# Patient Record
Sex: Male | Born: 1980 | Race: White | Hispanic: No | Smoking: Former smoker
Health system: Southern US, Community
[De-identification: ages and names within clinical notes are randomized; demographics above are authoritative.]

## PROBLEM LIST (undated history)

## (undated) DIAGNOSIS — I499 Cardiac arrhythmia, unspecified: Secondary | ICD-10-CM

## (undated) DIAGNOSIS — N200 Calculus of kidney: Secondary | ICD-10-CM

## (undated) DIAGNOSIS — J302 Other seasonal allergic rhinitis: Secondary | ICD-10-CM

## (undated) DIAGNOSIS — K219 Gastro-esophageal reflux disease without esophagitis: Secondary | ICD-10-CM

---
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
## 2005-01-20 ENCOUNTER — Emergency Department: Payer: Self-pay | Admitting: General Practice

## 2005-01-20 ENCOUNTER — Other Ambulatory Visit: Payer: Self-pay

## 2006-01-22 ENCOUNTER — Emergency Department: Payer: Self-pay | Admitting: Emergency Medicine

## 2006-06-11 ENCOUNTER — Emergency Department: Payer: Self-pay | Admitting: Emergency Medicine

## 2008-02-12 ENCOUNTER — Ambulatory Visit: Payer: Self-pay | Admitting: Gastroenterology

## 2008-03-09 ENCOUNTER — Ambulatory Visit: Payer: Self-pay | Admitting: Surgery

## 2008-04-24 HISTORY — PX: NASAL SEPTUM SURGERY: SHX37

## 2008-12-30 ENCOUNTER — Emergency Department: Payer: Self-pay | Admitting: Emergency Medicine

## 2009-01-01 ENCOUNTER — Other Ambulatory Visit: Payer: Self-pay | Admitting: Family

## 2009-01-04 ENCOUNTER — Ambulatory Visit: Payer: Self-pay | Admitting: Unknown Physician Specialty

## 2009-01-28 ENCOUNTER — Ambulatory Visit: Payer: Self-pay | Admitting: Unknown Physician Specialty

## 2009-02-08 ENCOUNTER — Emergency Department: Payer: Self-pay | Admitting: Emergency Medicine

## 2009-03-29 ENCOUNTER — Ambulatory Visit: Payer: Self-pay | Admitting: Internal Medicine

## 2009-04-04 ENCOUNTER — Emergency Department: Payer: Self-pay | Admitting: Emergency Medicine

## 2009-04-13 ENCOUNTER — Emergency Department: Payer: Self-pay | Admitting: Emergency Medicine

## 2009-04-14 ENCOUNTER — Other Ambulatory Visit: Payer: Self-pay | Admitting: Gastroenterology

## 2009-04-21 ENCOUNTER — Inpatient Hospital Stay: Payer: Self-pay | Admitting: Internal Medicine

## 2009-04-30 ENCOUNTER — Ambulatory Visit: Payer: Self-pay | Admitting: Unknown Physician Specialty

## 2009-05-05 ENCOUNTER — Ambulatory Visit: Payer: Self-pay | Admitting: Internal Medicine

## 2009-05-14 ENCOUNTER — Ambulatory Visit: Payer: Self-pay | Admitting: Unknown Physician Specialty

## 2009-10-07 ENCOUNTER — Ambulatory Visit: Payer: Self-pay | Admitting: Internal Medicine

## 2010-04-13 IMAGING — CT CT MAXILLOFACIAL WITHOUT CONTRAST
1 of 2 series · 15 of 30 positions shown, 19 images · non-contrast
Comparison: No comparison

REASON FOR EXAM: chronic sinusitis
COMMENTS:

PROCEDURE:     CT  - CT MAXILLOFACIAL (CRISTYNEL)  - April 30, 2009  [DATE]
RESULT:     History: Chronic sinusitis
TECHNIQUE: Multiple axial images obtained of the maxillofacial bones with
coronal reformatted images provided.

[Series 3: (person_name) series 1mm · axial · 0.37mm/px · z∈[+149,+304]mm · 15 of 171 slices shown, 19 images]
[im 8/171  brain]
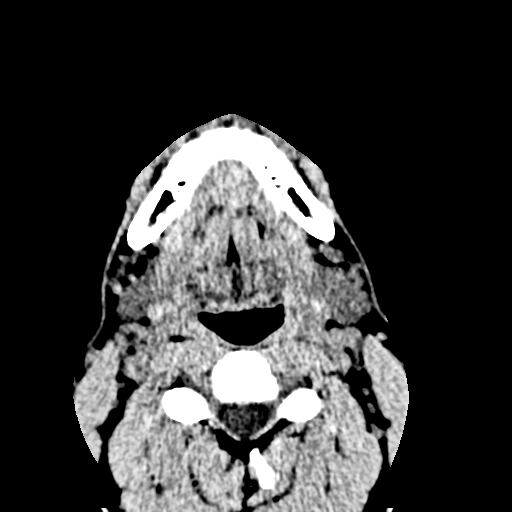
[im 8/171  bone]
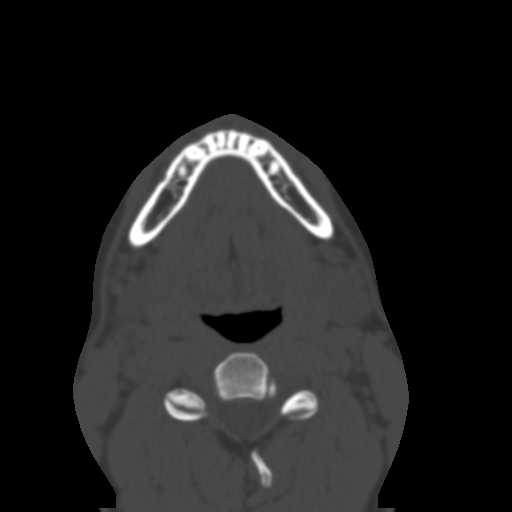
[im 24/171  bone]
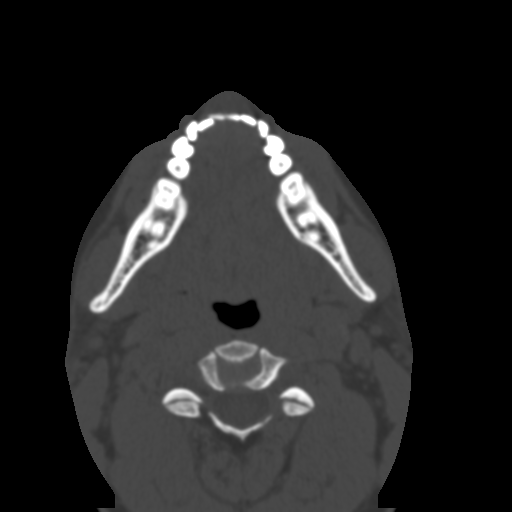
[im 31/171  bone]
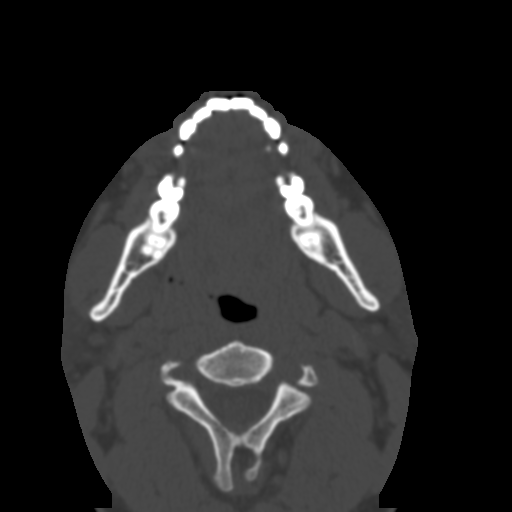
[im 39/171  bone]
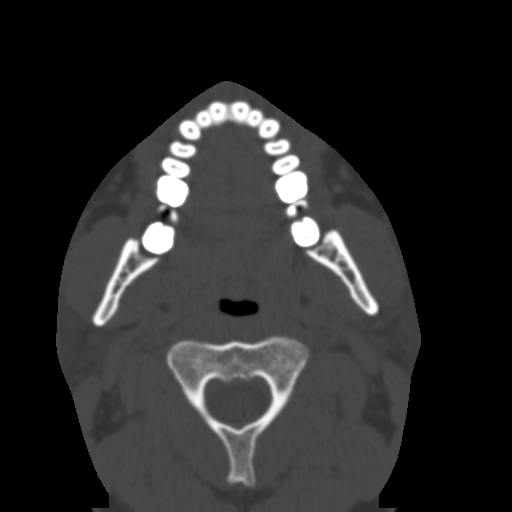
[im 55/171  brain]
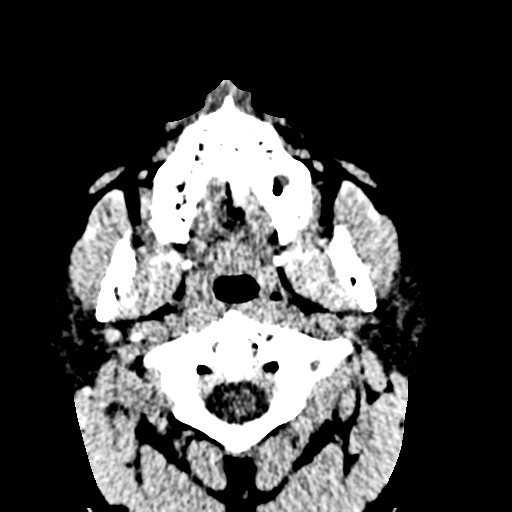
[im 55/171  bone]
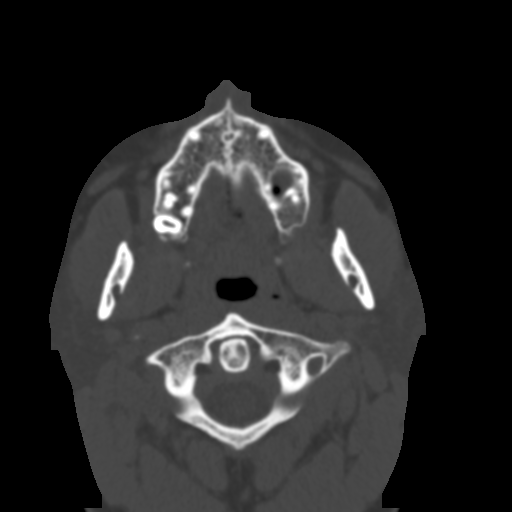
[im 62/171  bone]
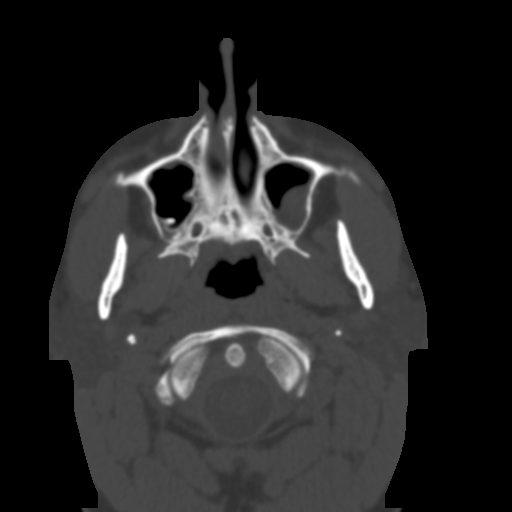
[im 78/171  bone]
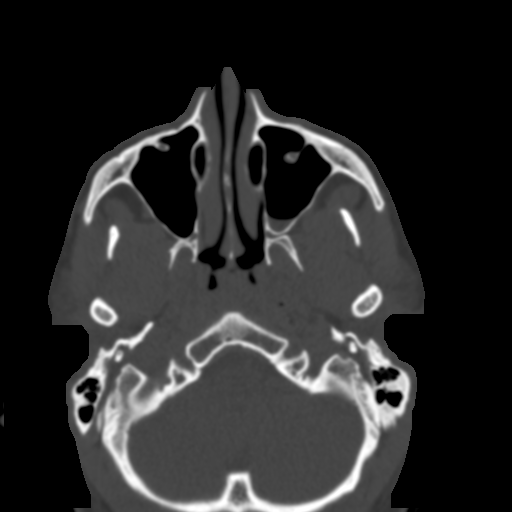
[im 86/171  bone]
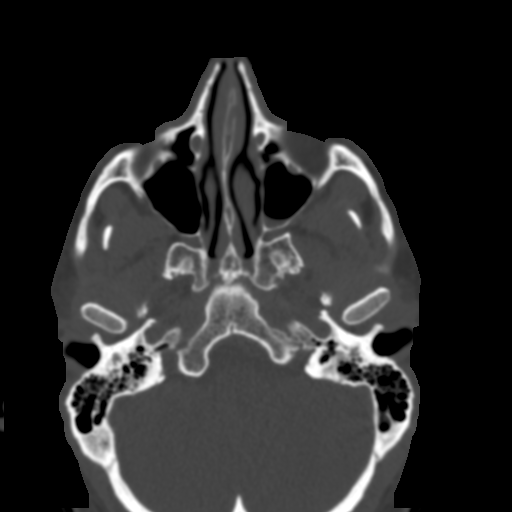
[im 93/171  brain]
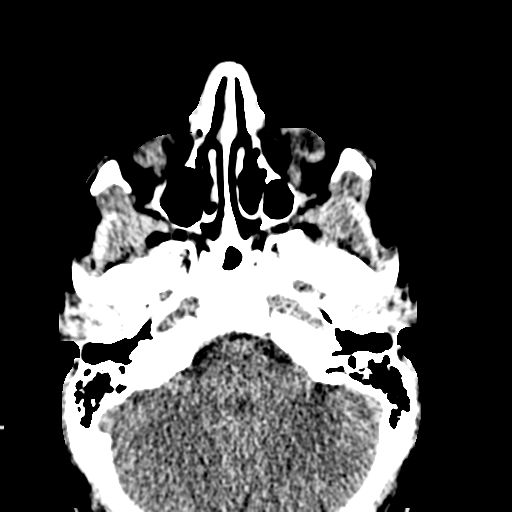
[im 93/171  bone]
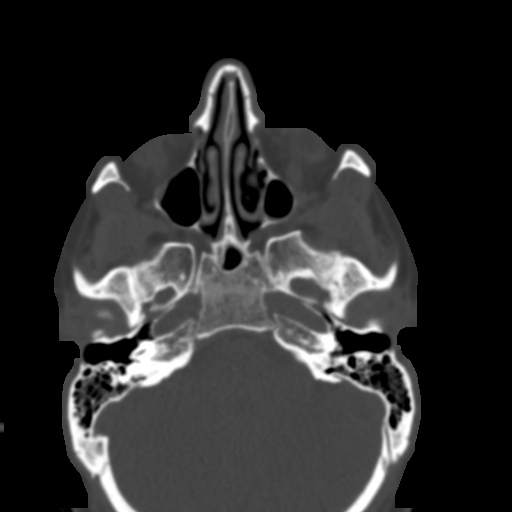
[im 109/171  bone]
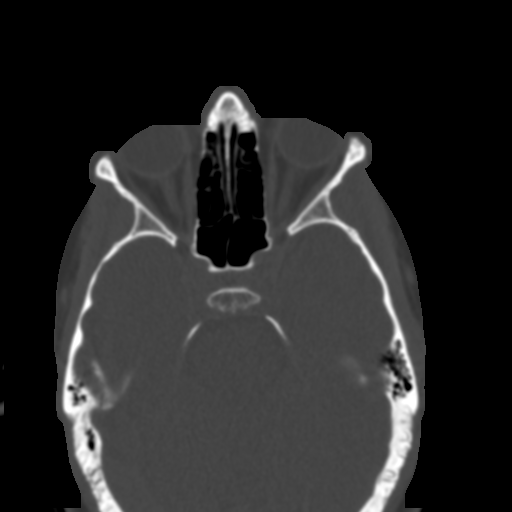
[im 116/171  bone]
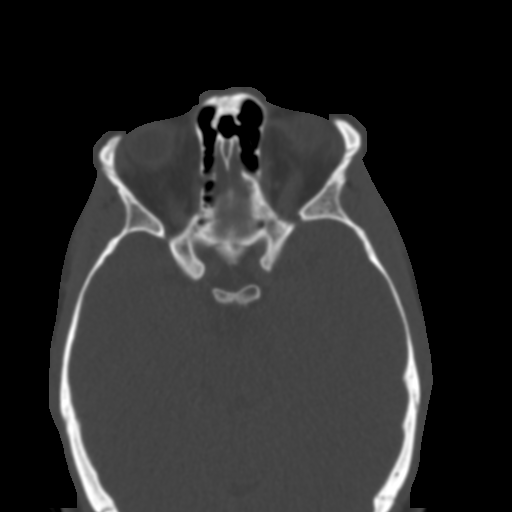
[im 132/171  bone]
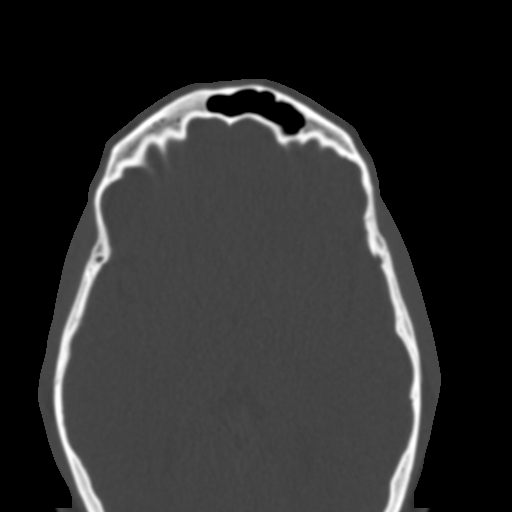
[im 140/171  brain]
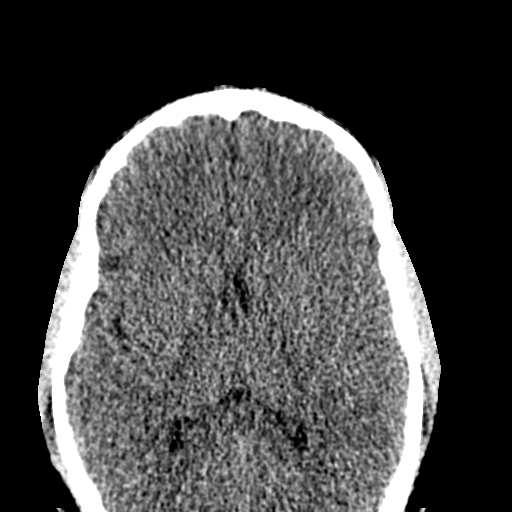
[im 140/171  bone]
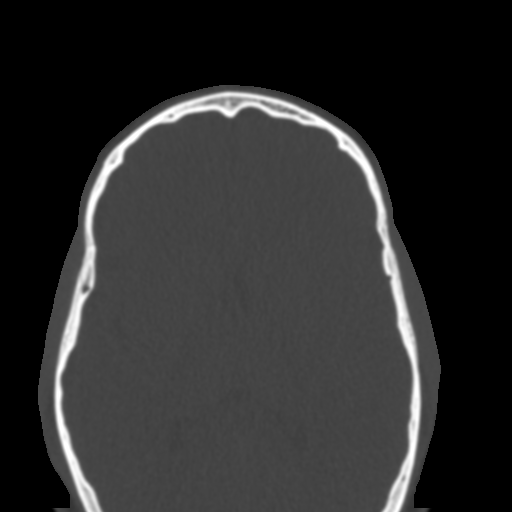
[im 147/171  bone]
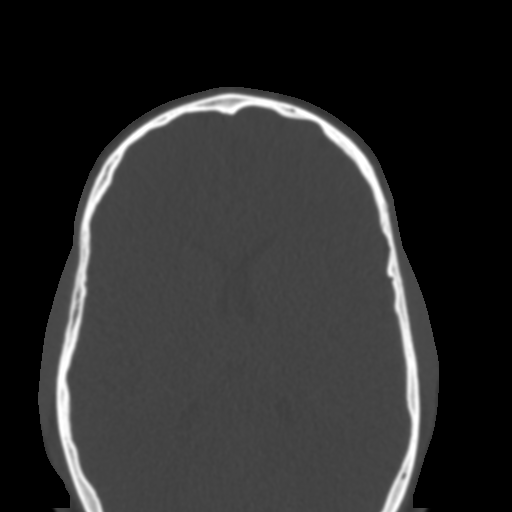
[im 163/171  bone]
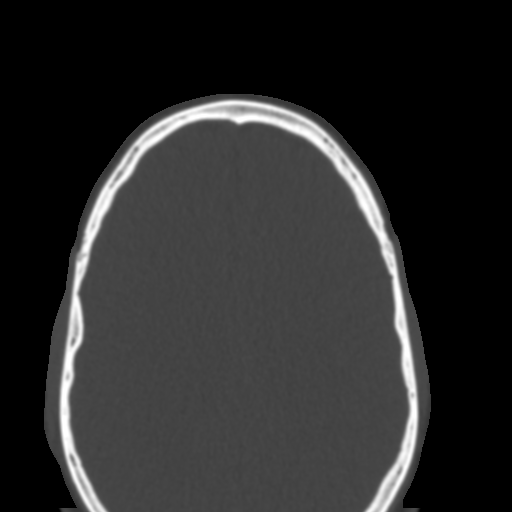

[15 of 30 positions shown; findings below may reference images not displayed]

FINDINGS: The globes are intact. The orbital walls are intact. The orbital floor is
intact. The maxilla and mandible are intact. The zygomatic arches are
intact. The nasal septum is midline. There is no nasal bone fracture. The
temporomandibular joints are normal.

There is mild mucosal thickening in the inferior left maxillary sinus. There
is a small air-fluid level in the left maxillary sinus. The right maxillary
sinus is clear. The ethmoid sinuses, sphenoid sinuses and frontal sinuses
are clear. The frontal ethmoidal recesses are clear. The ostiomeatal
complexes are patent. The visualized portions of the mastoid sinuses are
well aerated.
IMPRESSION: Mild left maxillary sinusitis.

## 2010-04-18 IMAGING — US US EXTREM LOW VENOUS*L*
1 series · 17 of 22 positions shown · non-contrast
Comparison: none

REASON FOR EXAM: call report  [id number]  left calf pain and swelling
COMMENTS:

[Series 1: us extrem low venous*left* · 17 of 22 slices shown]
[im 1/22]
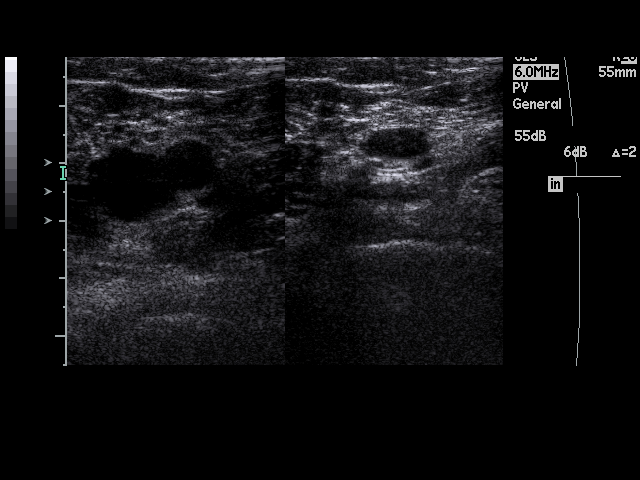
[im 2/22]
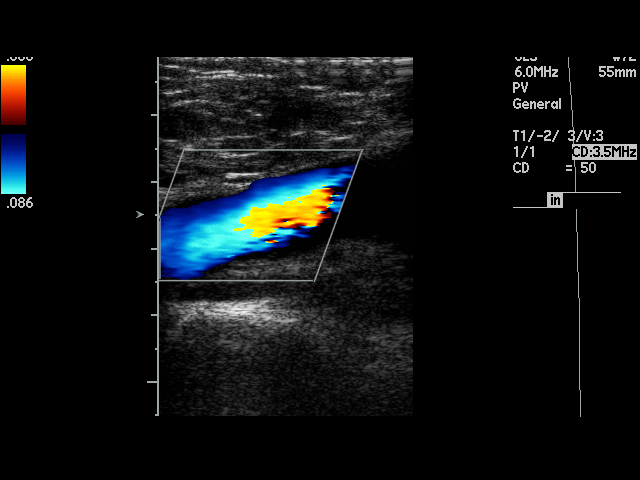
[im 4/22]
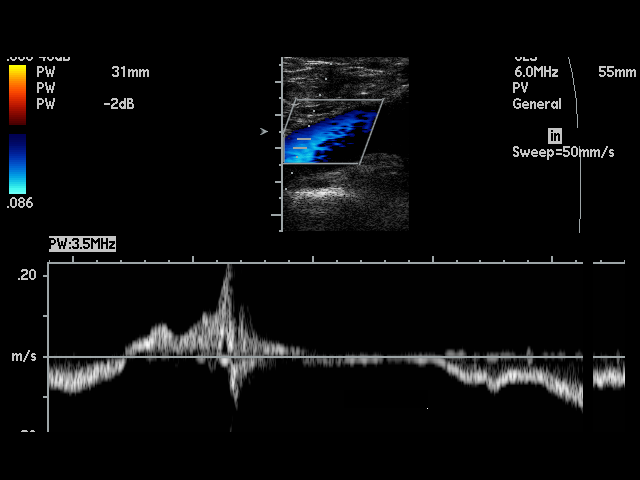
[im 5/22]
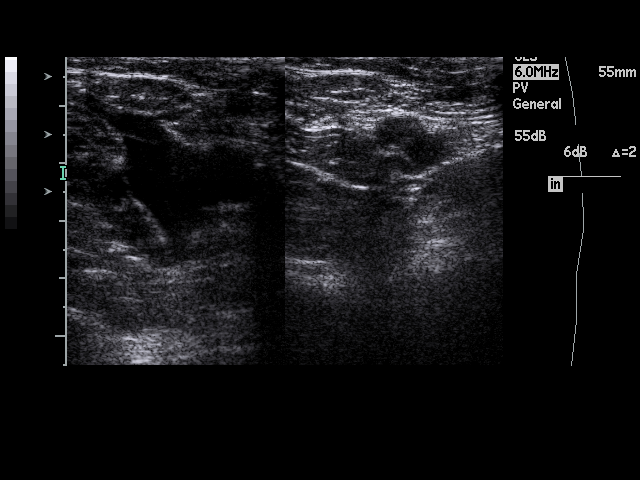
[im 6/22]
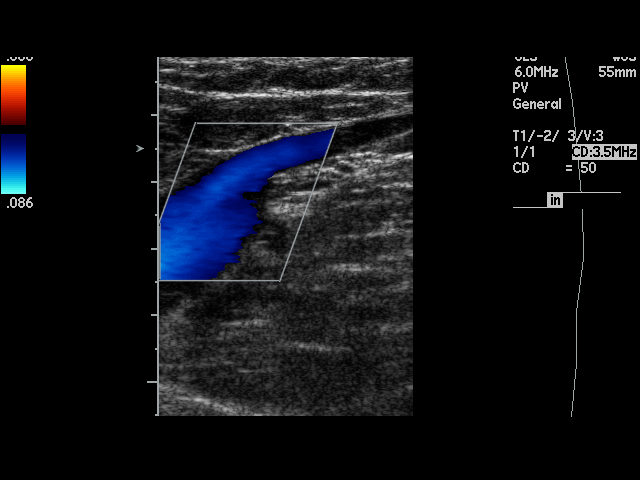
[im 8/22]
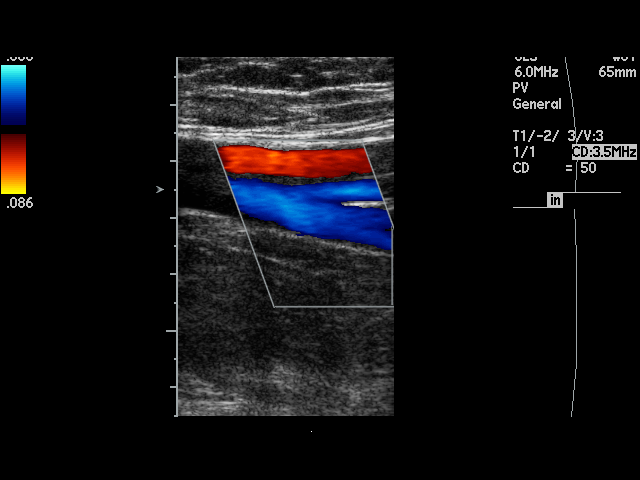
[im 9/22]
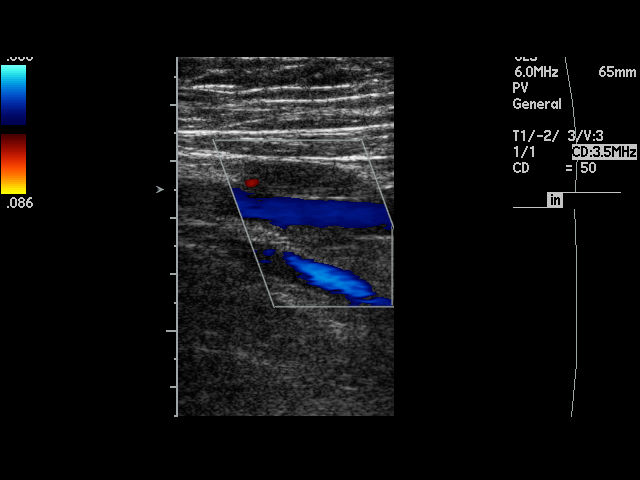
[im 10/22]
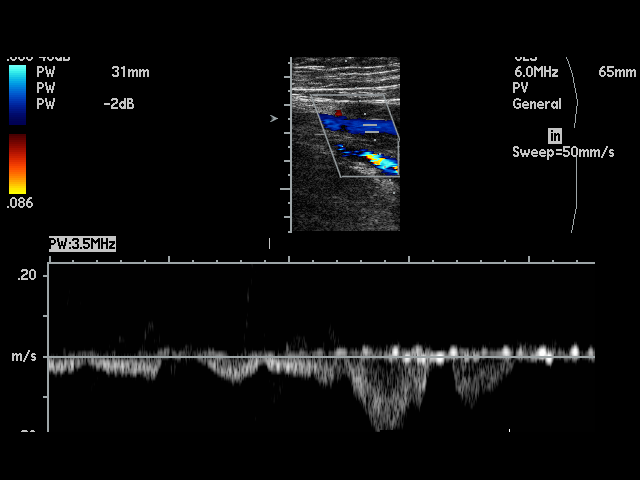
[im 12/22]
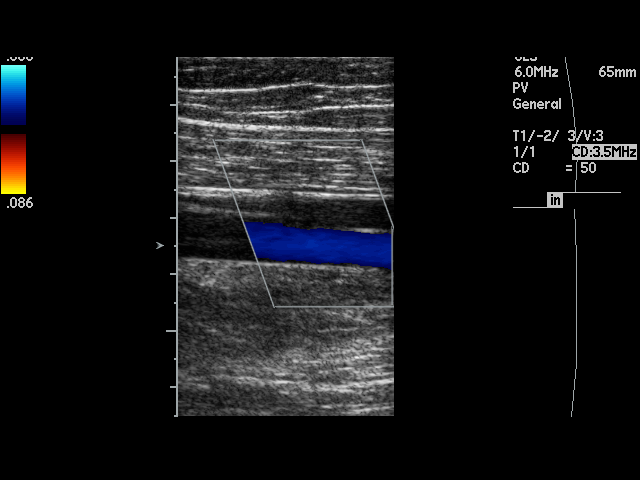
[im 13/22]
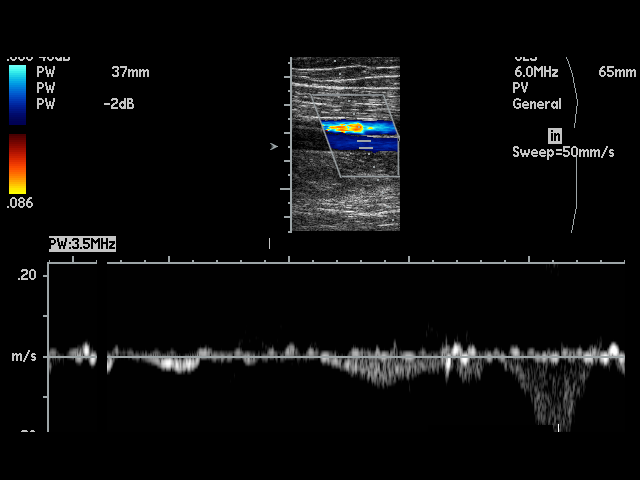
[im 14/22]
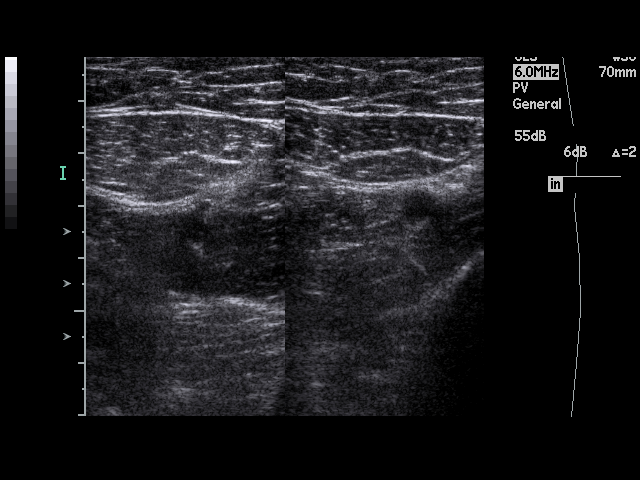
[im 15/22]
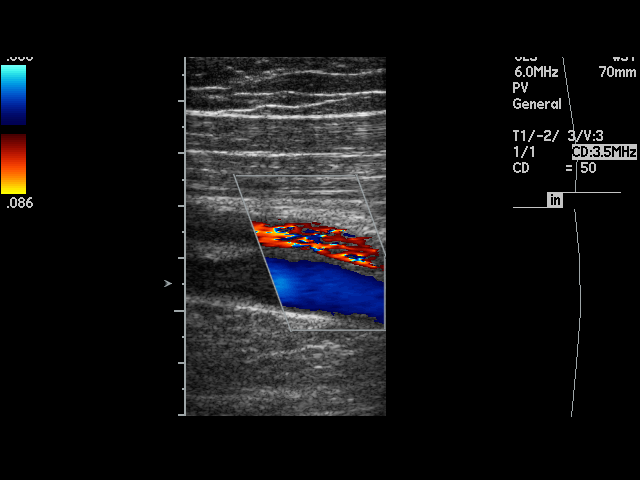
[im 17/22]
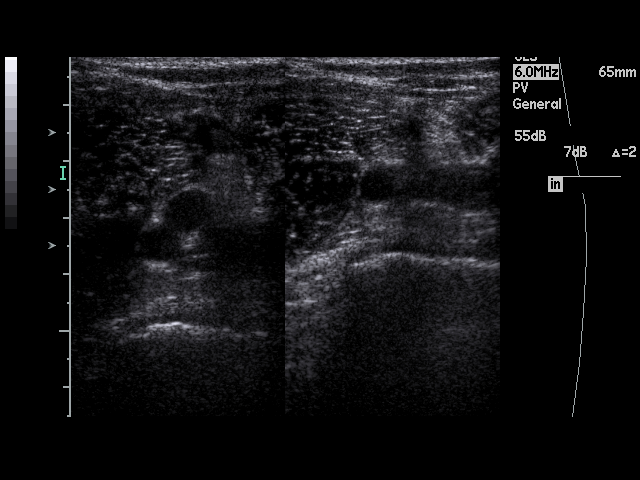
[im 18/22]
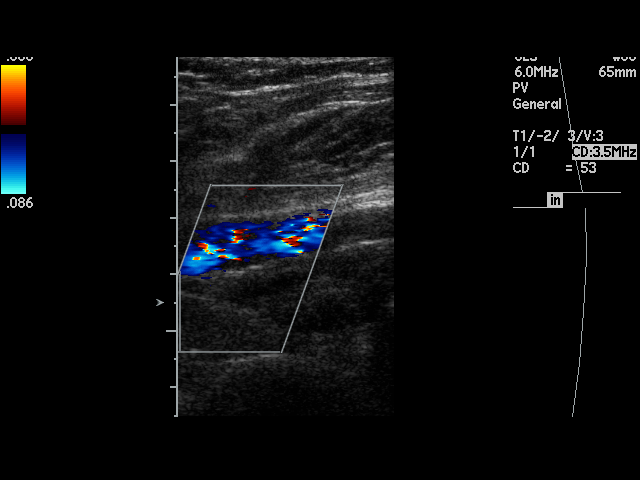
[im 19/22]
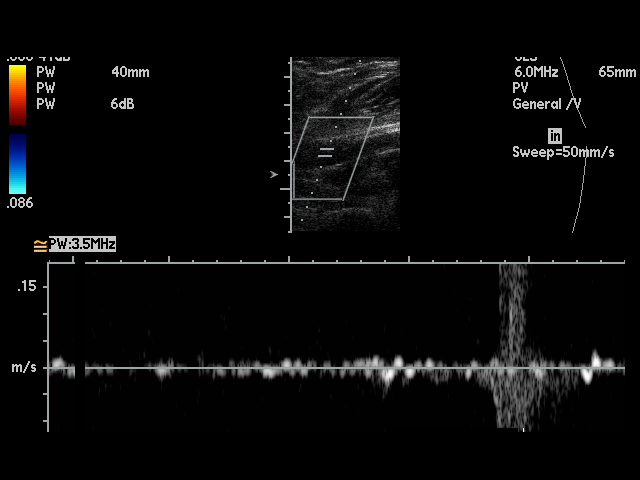
[im 21/22]
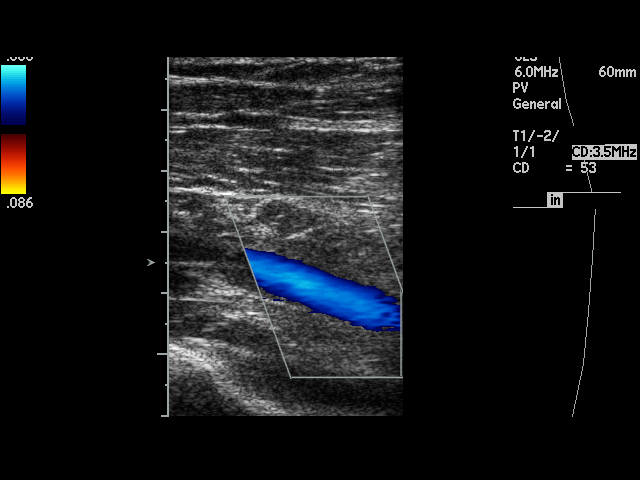
[im 22/22]
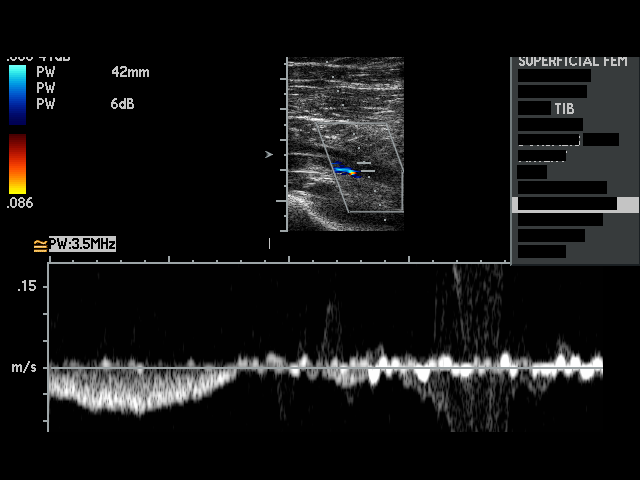

[17 of 22 positions shown; findings below may reference images not displayed]

PROCEDURE:     US  - US DOPPLER LOW EXTR LEFT  - May 05, 2009  [DATE]

RESULT:     The augmentation, Valsalva and phasic flow waveforms are normal
in appearance. The femoral and popliteal vein shows complete compressibility
throughout its course. Doppler examination shows no occlusion or evidence of
deep vein thrombosis.
IMPRESSION: No deep vein thrombosis is identified in the left leg.

## 2010-10-06 ENCOUNTER — Ambulatory Visit: Payer: Self-pay | Admitting: General Practice

## 2013-09-03 DIAGNOSIS — I493 Ventricular premature depolarization: Secondary | ICD-10-CM | POA: Insufficient documentation

## 2013-09-03 DIAGNOSIS — Z9109 Other allergy status, other than to drugs and biological substances: Secondary | ICD-10-CM | POA: Insufficient documentation

## 2015-03-11 DIAGNOSIS — E7800 Pure hypercholesterolemia, unspecified: Secondary | ICD-10-CM | POA: Insufficient documentation

## 2015-03-17 ENCOUNTER — Encounter: Payer: Self-pay | Admitting: Physician Assistant

## 2015-03-17 ENCOUNTER — Ambulatory Visit: Payer: Self-pay | Admitting: Physician Assistant

## 2015-03-17 VITALS — BP 136/90 | Temp 98.6°F

## 2015-03-17 DIAGNOSIS — J00 Acute nasopharyngitis [common cold]: Secondary | ICD-10-CM

## 2015-03-17 DIAGNOSIS — K219 Gastro-esophageal reflux disease without esophagitis: Secondary | ICD-10-CM | POA: Insufficient documentation

## 2015-03-17 DIAGNOSIS — I10 Essential (primary) hypertension: Secondary | ICD-10-CM | POA: Insufficient documentation

## 2015-03-17 NOTE — Progress Notes (Signed)
S: cold sx for 1 day, mucus is clear, ?if looks infected and needs antibiotic or will be ok waiting it out, denies fever/chills/body aches or cough  O: vitals wnl, nad, tms clear, nasal mucosa inflamed, throat wnl, neck supple no lymph, lungs c t a, cv rrr  A: common cold  P: f/u prn, otc meds, if worsening over the weekend will call in antibiotic on monday

## 2015-04-14 ENCOUNTER — Encounter: Payer: Self-pay | Admitting: Physician Assistant

## 2015-04-14 ENCOUNTER — Ambulatory Visit: Payer: Self-pay | Admitting: Physician Assistant

## 2015-04-14 VITALS — BP 140/90 | HR 76 | Temp 98.4°F

## 2015-04-14 DIAGNOSIS — R05 Cough: Secondary | ICD-10-CM

## 2015-04-14 DIAGNOSIS — R059 Cough, unspecified: Secondary | ICD-10-CM

## 2015-04-14 NOTE — Progress Notes (Signed)
S: C/o runny nose and congestion for 3 days, no fever, chills, cp/sob, v/d; mucus was yellow this am but clear throughout the day, cough is sporadic,   O: PE: vitals wnl, nad,  perrl eomi, normocephalic, tms dull, nasal mucosa red and swollen, throat injected, neck supple no lymph, lungs c t a, cv rrr, neuro intact  A:  Acute viral uri   P: drink fluids, continue regular meds , use otc meds of choice, return if not improving in 5 days, return earlier if worsening , if worsening by weekend will call in antibiotic

## 2015-06-29 ENCOUNTER — Ambulatory Visit: Payer: Self-pay | Admitting: Physician Assistant

## 2015-06-29 ENCOUNTER — Encounter: Payer: Self-pay | Admitting: Physician Assistant

## 2015-06-29 VITALS — BP 130/90 | HR 82 | Temp 98.3°F

## 2015-06-29 DIAGNOSIS — R109 Unspecified abdominal pain: Secondary | ICD-10-CM

## 2015-06-29 DIAGNOSIS — R300 Dysuria: Secondary | ICD-10-CM

## 2015-06-29 DIAGNOSIS — R10A2 Flank pain, left side: Secondary | ICD-10-CM

## 2015-06-29 LAB — POCT URINALYSIS DIPSTICK
Bilirubin, UA: NEGATIVE
Glucose, UA: NEGATIVE
Ketones, UA: NEGATIVE
Leukocytes, UA: NEGATIVE
Nitrite, UA: NEGATIVE
Protein, UA: NEGATIVE
Spec Grav, UA: >=1.03
Urobilinogen, UA: 0.2
pH, UA: 6

## 2015-06-29 NOTE — Progress Notes (Signed)
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 Pain has resolved but still with urgency Denies personal or fam hx of kidney stones  PMHx reviewed   O/ VSS  Alert NAD  Heart RSR without m ,r , g Lungs clear Abd ; soft , + L CVA and suprapubic tenderness without guarding   U/A negative A/ L CVA with urgency  Negative u/a  P / appointment for urological eval tomorrow at  9 am. He feels fine to work. He is given rx of septra ds i bid , hydrocodone 5 /tylenol 325 mg i q 6 h prn severe pain #10 0 rf. Hydration encouraged  , no caffeine . S & S reviewed in which to seek urgent care and he voices understanding.

## 2015-06-30 ENCOUNTER — Emergency Department: Payer: Managed Care, Other (non HMO)

## 2015-06-30 ENCOUNTER — Encounter: Payer: Self-pay | Admitting: *Deleted

## 2015-06-30 ENCOUNTER — Emergency Department
Admission: EM | Admit: 2015-06-30 | Discharge: 2015-06-30 | Disposition: A | Discharge status: Home / Self Care | Payer: Managed Care, Other (non HMO) | Attending: Emergency Medicine | Admitting: Emergency Medicine

## 2015-06-30 ENCOUNTER — Encounter: Payer: Self-pay | Admitting: Emergency Medicine

## 2015-06-30 ENCOUNTER — Encounter
Admission: RE | Admit: 2015-06-30 | Discharge: 2015-06-30 | Disposition: A | Discharge status: Home / Self Care | Payer: Managed Care, Other (non HMO) | Source: Ambulatory Visit | Attending: Urology | Admitting: Urology

## 2015-06-30 DIAGNOSIS — Z79899 Other long term (current) drug therapy: Secondary | ICD-10-CM | POA: Diagnosis not present

## 2015-06-30 DIAGNOSIS — Z881 Allergy status to other antibiotic agents status: Secondary | ICD-10-CM | POA: Diagnosis not present

## 2015-06-30 DIAGNOSIS — R10A0 Flank pain, unspecified side: Secondary | ICD-10-CM

## 2015-06-30 DIAGNOSIS — Z87891 Personal history of nicotine dependence: Secondary | ICD-10-CM | POA: Diagnosis not present

## 2015-06-30 DIAGNOSIS — N201 Calculus of ureter: Secondary | ICD-10-CM | POA: Diagnosis present

## 2015-06-30 DIAGNOSIS — N200 Calculus of kidney: Secondary | ICD-10-CM

## 2015-06-30 DIAGNOSIS — I499 Cardiac arrhythmia, unspecified: Secondary | ICD-10-CM | POA: Diagnosis not present

## 2015-06-30 DIAGNOSIS — R1032 Left lower quadrant pain: Secondary | ICD-10-CM | POA: Diagnosis present

## 2015-06-30 DIAGNOSIS — Z888 Allergy status to other drugs, medicaments and biological substances status: Secondary | ICD-10-CM | POA: Diagnosis not present

## 2015-06-30 DIAGNOSIS — R109 Unspecified abdominal pain: Secondary | ICD-10-CM

## 2015-06-30 LAB — CBC WITH DIFFERENTIAL/PLATELET
Basophils Absolute: 0 10*3/uL (ref 0–0.1)
Basophils Relative: 1 %
Eosinophils Absolute: 0.1 10*3/uL (ref 0–0.7)
Eosinophils Relative: 1 %
HCT: 43.8 % (ref 40.0–52.0)
Hemoglobin: 15.1 g/dL (ref 13.0–18.0)
Lymphocytes Relative: 31 %
Lymphs Abs: 2 K/uL (ref 1.0–3.6)
MCH: 27.2 pg (ref 26.0–34.0)
MCHC: 34.5 g/dL (ref 32.0–36.0)
MCV: 78.8 fL — ABNORMAL LOW (ref 80.0–100.0)
Monocytes Absolute: 0.3 10*3/uL (ref 0.2–1.0)
Monocytes Relative: 4 %
Neutro Abs: 4 10*3/uL (ref 1.4–6.5)
Neutrophils Relative %: 63 %
Platelets: 169 10*3/uL (ref 150–440)
RBC: 5.56 MIL/uL (ref 4.40–5.90)
RDW: 13.2 % (ref 11.5–14.5)
WBC: 6.4 K/uL (ref 3.8–10.6)

## 2015-06-30 LAB — URINALYSIS COMPLETE WITH MICROSCOPIC (ARMC ONLY)
Bacteria, UA: NONE SEEN
Bilirubin Urine: NEGATIVE
Glucose, UA: NEGATIVE mg/dL
Ketones, ur: NEGATIVE mg/dL
Leukocytes, UA: NEGATIVE
Nitrite: NEGATIVE
Protein, ur: NEGATIVE mg/dL
Specific Gravity, Urine: 1.013 (ref 1.005–1.030)
Squamous Epithelial / HPF: NONE SEEN
pH: 7 (ref 5.0–8.0)

## 2015-06-30 LAB — BASIC METABOLIC PANEL
Chloride: 104 mmol/L (ref 101–111)
Creatinine, Ser: 0.9 mg/dL (ref 0.61–1.24)
GFR calc Af Amer: >60 mL/min (ref >60)
Potassium: 3.4 mmol/L — ABNORMAL LOW (ref 3.5–5.1)
Sodium: 139 mmol/L (ref 135–145)

## 2015-06-30 LAB — BASIC METABOLIC PANEL WITH GFR
Anion gap: 8 (ref 5–15)
BUN: 16 mg/dL (ref 6–20)
CO2: 27 mmol/L (ref 22–32)
Calcium: 8.9 mg/dL (ref 8.9–10.3)
GFR calc non Af Amer: >60 mL/min (ref >60)
Glucose, Bld: 119 mg/dL — ABNORMAL HIGH (ref 65–99)

## 2015-06-30 MED ORDER — MORPHINE SULFATE (PF) 10 MG/ML IV SOLN: 10.0000 mg | Freq: Once | INTRAVENOUS | Status: DC

## 2015-06-30 MED ORDER — ONDANSETRON 4 MG PO TBDP: 4.0000 mg | ORAL_TABLET | Freq: Three times a day (TID) | ORAL | Status: DC | PRN

## 2015-06-30 MED ORDER — OXYCODONE-ACETAMINOPHEN 5-325 MG PO TABS
1.0000 | ORAL_TABLET | Freq: Once | ORAL | Status: AC
Start: 2015-06-30 — End: 2015-06-30
  Administered 2015-06-30: 1 via ORAL
  Filled 2015-06-30: qty 1

## 2015-06-30 MED ORDER — ONDANSETRON 4 MG PO TBDP
4.0000 mg | ORAL_TABLET | Freq: Once | ORAL | Status: AC
  Administered 2015-06-30: 4 mg via ORAL
  Filled 2015-06-30: qty 1

## 2015-06-30 MED ORDER — OXYCODONE-ACETAMINOPHEN 5-325 MG PO TABS: 1.0000 | ORAL_TABLET | ORAL | Status: DC | PRN

## 2015-06-30 MED ORDER — TAMSULOSIN HCL 0.4 MG PO CAPS
0.4000 mg | ORAL_CAPSULE | Freq: Once | ORAL | Status: AC
  Administered 2015-06-30: 0.4 mg via ORAL
  Filled 2015-06-30: qty 1

## 2015-06-30 MED ORDER — TAMSULOSIN HCL 0.4 MG PO CAPS: 0.4000 mg | ORAL_CAPSULE | Freq: Every day | ORAL | Status: DC

## 2015-06-30 MED ORDER — SODIUM CHLORIDE 0.9 % IV BOLUS (SEPSIS)
1000.0000 mL | Freq: Once | INTRAVENOUS | Status: AC
  Administered 2015-06-30: 1000 mL via INTRAVENOUS

## 2015-06-30 NOTE — ED Provider Notes (Signed)
[facility] Emergency Department Provider Note  ____________________________________________  Time seen: Approximately 1:34 AM  I have reviewed the triage vital signs and the nursing notes.   HISTORY  Chief Complaint Flank Pain    HPI Justin Decker is a 35 y.o. male who presents to the ED from home with the chief complaint of left flank to left lower quadrant abdominal pain. Patient reports intermittent pain 2 days. 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 He had a urine dipstick which was positive for blood; suspected diagnosis of kidney stones which patient has never had personally. He was set up with urology appointment today but came to the ED secondary to increased pain associated with vomiting. Denies fever, chills, chest pain, shortness of breath, diarrhea, testicular pain or swelling. Does complain of urinary hesitancy. Denies recent travel or trauma. Nothing makes this pain better or worse.   Past medical history None  Patient Active Problem List   Diagnosis Date Noted  . Acid reflux 03/17/2015  . BP (high blood pressure) 03/17/2015  . Pure hypercholesterolemia 03/11/2015  . 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 Beat, premature ventricular 09/03/2013    History reviewed. No pertinent past surgical history.  Current Outpatient Rx  Name  Route  Sig  Dispense  Refill  . dexlansoprazole (DEXILANT) 60 MG capsule      TAKE ONE CAPSULE BY MOUTH EVERY DAY         . flecainide (TAMBOCOR) 100 MG tablet   Oral   Take by mouth.         . levocetirizine (XYZAL) 5 MG tablet   Oral   Take by mouth.         . ondansetron (ZOFRAN ODT) 4 MG disintegrating tablet   Oral   Take 1 tablet (4 mg total) by mouth every 8 (eight) hours as needed for nausea or vomiting.   30 tablet   0   . oxyCODONE-acetaminophen (ROXICET) 5-325 MG tablet   Oral   Take 1 tablet by mouth every 4 (four) hours as  needed for severe pain.   30 tablet   0   . tamsulosin (FLOMAX) 0.4 MG CAPS capsule   Oral   Take 1 capsule (0.4 mg total) by mouth daily.   14 capsule   0     Allergies Clindamycin/lincomycin; Moxifloxacin; and Prednisone  No family history on file.  Social History Social History  Substance Use Topics  . Smoking status: Former Games developer  . Smokeless tobacco: None  . Alcohol Use: None    Review of Systems  Constitutional: No fever/chills. Eyes: No visual changes. ENT: No sore throat. Cardiovascular: Denies chest pain. Respiratory: Denies shortness of breath. Gastrointestinal: Positive for abdominal pain.  Positive for nausea and vomiting.  No diarrhea.  No constipation. Genitourinary: Negative for dysuria. Musculoskeletal: Positive for left flank pain. Skin: Negative for rash. Neurological: Negative for headaches, focal weakness or numbness.  10-point ROS otherwise negative.  ____________________________________________   PHYSICAL EXAM:  VITAL SIGNS: ED Triage Vitals  Enc Vitals Group     BP 06/30/15 0026 144/102 mmHg     Pulse Rate 06/30/15 0026 60     Resp 06/30/15 0026 20     Temp 06/30/15 0026 97.7 F (36.5 C)     Temp src --      SpO2 06/30/15 0026 99 %     Weight 06/30/15 0026 250 lb (113.399 kg)     Height  06/30/15 0026 6\' 2"  (1.88 m)     Head Cir --      Peak Flow --      Pain Score 06/30/15 0025 10     Pain Loc --      Pain Edu? --      Excl. in GC? --     Constitutional: Alert and oriented. Well appearing and in no acute distress. Eyes: Conjunctivae are normal. PERRL. EOMI. Head: Atraumatic. Nose: No congestion/rhinnorhea. Mouth/Throat: Mucous membranes are moist.  Oropharynx non-erythematous. Neck: No stridor.   Cardiovascular: Normal rate, regular rhythm. Grossly normal heart sounds.  Good peripheral circulation. Respiratory: Normal respiratory effort.  No retractions. Lungs CTAB. Gastrointestinal: Soft and nontender. No distention. No  abdominal bruits. No CVA tenderness. Musculoskeletal: No lower extremity tenderness nor edema.  No joint effusions. Neurologic:  Normal speech and language. No gross focal neurologic deficits are appreciated. No gait instability. Skin:  Skin is warm, dry and intact. No rash noted. Psychiatric: Mood and affect are normal. Speech and behavior are normal.  ____________________________________________   LABS (all labs ordered are listed, but only abnormal results are displayed)  Labs Reviewed  CBC WITH DIFFERENTIAL/PLATELET - Abnormal; Notable for the following:    MCV 78.8 (*)    All other components within normal limits  BASIC METABOLIC PANEL - Abnormal; Notable for the following:    Potassium 3.4 (*)    Glucose, Bld 119 (*)    All other components within normal limits  URINALYSIS COMPLETEWITH MICROSCOPIC ([facility] ONLY) - Abnormal; Notable for the following:    Color, Urine YELLOW (*)    APPearance CLEAR (*)    Hgb urine dipstick 1+ (*)    All other components within normal limits   ____________________________________________  EKG  None ____________________________________________  RADIOLOGY  CT renal stone study interpreted per Dr. Andria MeuseStevens: 5 mm stone in the distal left ureter with moderate proximal obstruction. ____________________________________________   PROCEDURES  Procedure(s) performed: None  Critical Care performed: No  ____________________________________________   INITIAL IMPRESSION / ASSESSMENT AND PLAN / ED COURSE  Pertinent labs & imaging results that were available during my care of the patient were reviewed by me and considered in my medical decision making (see chart for details).  35 year old male who presents with left flank to left lower quadrant pain associated with vomiting. Kidney function is normal and CT scan reveals 5 mm distal left ureteral stone with moderate proximal obstruction. Urinalysis is pending. Patient declines opioid analgesia  or antiemetic at this time secondary to driving. Will administer Flomax. Patient has an appointment with Dr. Evelene CroonWolff from urology this morning at 9 AM.  ----------------------------------------- 2:33 AM on 06/30/2015 -----------------------------------------  Updated patient and negative urinalysis. Advised patient to keep scheduled appointment with urology this morning. Strict return precautions given. Patient verbalizes understanding and agrees with plan of care. ____________________________________________   FINAL CLINICAL IMPRESSION(S) / ED DIAGNOSES  Final diagnoses:  Kidney stone  Flank pain      Irean HongJade J Aldena Worm, MD 06/30/15 [phone]

## 2015-06-30 NOTE — ED Notes (Signed)
Pt request no pain medication at this time. 

## 2015-06-30 NOTE — ED Notes (Signed)
Patient transported to CT via stretcher.

## 2015-06-30 NOTE — ED Notes (Signed)
Patient ambulatory to triage with steady gait, without difficulty, appears uncomfortable; pt c/o left flank/side pain since Monday with urinary urgency; st pain increased today, esp since 11pm

## 2015-06-30 NOTE — Discharge Instructions (Signed)
1. Take pain & nausea medicines as needed (Percocet/Zofran #30). Make sure to take a stool softener while taking narcotic pain medicines. °2. Take Flomax 0.4mg daily x 14 days. °3. Drink plenty of bottled or filtered water daily. °4. Return to the ER for worsening symptoms, persistent vomiting, fever, difficulty breathing or other concerns. ° ° °Kidney Stones °Kidney stones (urolithiasis) are deposits that form inside your kidneys. The intense pain is caused by the stone moving through the urinary tract. When the stone moves, the ureter goes into spasm around the stone. The stone is usually passed in the urine.  °CAUSES  °· A disorder that makes certain neck glands produce too much parathyroid hormone (primary hyperparathyroidism). °· A buildup of uric acid crystals, similar to gout in your joints. °· Narrowing (stricture) of the ureter. °· A kidney obstruction present at birth (congenital obstruction). °· Previous surgery on the kidney or ureters. °· Numerous kidney infections. °SYMPTOMS  °· Feeling sick to your stomach (nauseous). °· Throwing up (vomiting). °· Blood in the urine (hematuria). °· Pain that usually spreads (radiates) to the groin. °· Frequency or urgency of urination. °DIAGNOSIS  °· Taking a history and physical exam. °· Blood or urine tests. °· CT scan. °· Occasionally, an examination of the inside of the urinary bladder (cystoscopy) is performed. °TREATMENT  °· Observation. °· Increasing your fluid intake. °· Extracorporeal shock wave lithotripsy--This is a noninvasive procedure that uses shock waves to break up kidney stones. °· Surgery may be needed if you have severe pain or persistent obstruction. There are various surgical procedures. Most of the procedures are performed with the use of small instruments. Only small incisions are needed to accommodate these instruments, so recovery time is minimized. °The size, location, and chemical composition are all important variables that will determine  the proper choice of action for you. Talk to your health care provider to better understand your situation so that you will minimize the risk of injury to yourself and your kidney.  °HOME CARE INSTRUCTIONS  °· Drink enough water and fluids to keep your urine clear or pale yellow. This will help you to pass the stone or stone fragments. °· Strain all urine through the provided strainer. Keep all particulate matter and stones for your health care provider to see. The stone causing the pain may be as small as a grain of salt. It is very important to use the strainer each and every time you pass your urine. The collection of your stone will allow your health care provider to analyze it and verify that a stone has actually passed. The stone analysis will often identify what you can do to reduce the incidence of recurrences. °· Only take over-the-counter or prescription medicines for pain, discomfort, or fever as directed by your health care provider. °· Keep all follow-up visits as told by your health care provider. This is important. °· Get follow-up X-rays if required. The absence of pain does not always mean that the stone has passed. It may have only stopped moving. If the urine remains completely obstructed, it can cause loss of kidney function or even complete destruction of the kidney. It is your responsibility to make sure X-rays and follow-ups are completed. Ultrasounds of the kidney can show blockages and the status of the kidney. Ultrasounds are not associated with any radiation and can be performed easily in a matter of minutes. °· Make changes to your daily diet as told by your health care provider. You may be told   to: °¨ Limit the amount of salt that you eat. °¨ Eat 5 or more servings of fruits and vegetables each day. °¨ Limit the amount of meat, poultry, fish, and eggs that you eat. °· Collect a 24-hour urine sample as told by your health care provider. You may need to collect another urine sample every  6-12 months. °SEEK MEDICAL CARE IF: °· You experience pain that is progressive and unresponsive to any pain medicine you have been prescribed. °SEEK IMMEDIATE MEDICAL CARE IF:  °· Pain cannot be controlled with the prescribed medicine. °· You have a fever or shaking chills. °· The severity or intensity of pain increases over 18 hours and is not relieved by pain medicine. °· You develop a new onset of abdominal pain. °· You feel faint or pass out. °· You are unable to urinate. °  °This information is not intended to replace advice given to you by your health care provider. Make sure you discuss any questions you have with your health care provider. °  °Document Released: 04/10/2005 Document Revised: 12/30/2014 Document Reviewed: 09/11/2012 °[employer] Interactive Patient Education ©2016 [employer] ° °

## 2015-07-01 ENCOUNTER — Encounter
Admission: RE | Disposition: A | Discharge status: Home / Self Care | Payer: Self-pay | Source: Ambulatory Visit | Attending: Urology

## 2015-07-01 ENCOUNTER — Encounter: Payer: Self-pay | Admitting: *Deleted

## 2015-07-01 ENCOUNTER — Ambulatory Visit
Admission: RE | Admit: 2015-07-01 | Discharge: 2015-07-01 | Disposition: A | Discharge status: Home / Self Care | Payer: Managed Care, Other (non HMO) | Source: Ambulatory Visit | Attending: Urology | Admitting: Urology

## 2015-07-01 DIAGNOSIS — N201 Calculus of ureter: Secondary | ICD-10-CM | POA: Diagnosis not present

## 2015-07-01 DIAGNOSIS — Z888 Allergy status to other drugs, medicaments and biological substances status: Secondary | ICD-10-CM | POA: Insufficient documentation

## 2015-07-01 DIAGNOSIS — Z881 Allergy status to other antibiotic agents status: Secondary | ICD-10-CM | POA: Insufficient documentation

## 2015-07-01 DIAGNOSIS — I499 Cardiac arrhythmia, unspecified: Secondary | ICD-10-CM | POA: Insufficient documentation

## 2015-07-01 HISTORY — DX: Gastro-esophageal reflux disease without esophagitis: K21.9

## 2015-07-01 HISTORY — DX: Other seasonal allergic rhinitis: J30.2

## 2015-07-01 HISTORY — PX: EXTRACORPOREAL SHOCK WAVE LITHOTRIPSY: SHX1557

## 2015-07-01 SURGERY — LITHOTRIPSY, ESWL
Anesthesia: Moderate Sedation | Laterality: Left

## 2015-07-01 MED ORDER — DIPHENHYDRAMINE HCL 25 MG PO CAPS
25.0000 mg | ORAL_CAPSULE | ORAL | Status: AC
  Administered 2015-07-01: 25 mg via ORAL

## 2015-07-01 MED ORDER — MORPHINE SULFATE (PF) 10 MG/ML IV SOLN
10.0000 mg | Freq: Once | INTRAVENOUS | Status: AC
  Administered 2015-07-01: 10 mg via INTRAMUSCULAR

## 2015-07-01 MED ORDER — PROMETHAZINE HCL 25 MG/ML IJ SOLN
25.0000 mg | Freq: Once | INTRAMUSCULAR | Status: AC
  Administered 2015-07-01: 25 mg via INTRAMUSCULAR

## 2015-07-01 MED ORDER — MIDAZOLAM HCL 2 MG/2ML IJ SOLN
1.0000 mg | Freq: Once | INTRAMUSCULAR | Status: AC
  Administered 2015-07-01: 1 mg via INTRAMUSCULAR

## 2015-07-01 MED ORDER — MORPHINE SULFATE (PF) 10 MG/ML IV SOLN
INTRAVENOUS | Status: AC
  Filled 2015-07-01: qty 1

## 2015-07-01 MED ORDER — PROMETHAZINE HCL 25 MG/ML IJ SOLN
INTRAMUSCULAR | Status: DC
Start: 2015-07-01 — End: 2015-07-01
  Filled 2015-07-01: qty 1

## 2015-07-01 MED ORDER — FUROSEMIDE 10 MG/ML IJ SOLN: 10.0000 mg | Freq: Once | INTRAMUSCULAR | Status: DC

## 2015-07-01 MED ORDER — MIDAZOLAM HCL 2 MG/2ML IJ SOLN
INTRAMUSCULAR | Status: AC
  Filled 2015-07-01: qty 2

## 2015-07-01 MED ORDER — DIPHENHYDRAMINE HCL 25 MG PO CAPS
ORAL_CAPSULE | ORAL | Status: AC
  Filled 2015-07-01: qty 1

## 2015-07-01 MED ORDER — DEXTROSE-NACL 5-0.45 % IV SOLN
INTRAVENOUS | Status: DC
  Administered 2015-07-01: 12:00:00 via INTRAVENOUS

## 2015-07-01 MED ORDER — DOCUSATE SODIUM 100 MG PO CAPS: 200.0000 mg | ORAL_CAPSULE | Freq: Two times a day (BID) | ORAL | Status: DC

## 2015-07-01 MED ORDER — CEPHALEXIN 500 MG PO CAPS: 500.0000 mg | ORAL_CAPSULE | Freq: Four times a day (QID) | ORAL | Status: DC

## 2015-07-01 MED ORDER — FUROSEMIDE 40 MG PO TABS
40.0000 mg | ORAL_TABLET | Freq: Once | ORAL | Status: AC
  Administered 2015-07-01: 40 mg via ORAL
  Filled 2015-07-01: qty 1

## 2015-07-01 MED ORDER — DEXTROSE 5 % IV SOLN
1.0000 g | Freq: Once | INTRAVENOUS | Status: AC
  Administered 2015-07-01: 1 g via INTRAVENOUS
  Filled 2015-07-01: qty 10

## 2015-07-01 MED ORDER — NUCYNTA 50 MG PO TABS: 50.0000 mg | ORAL_TABLET | Freq: Four times a day (QID) | ORAL | Status: DC | PRN

## 2015-07-01 MED ORDER — ONDANSETRON 8 MG PO TBDP: 8.0000 mg | ORAL_TABLET | Freq: Four times a day (QID) | ORAL | Status: DC | PRN

## 2015-07-01 NOTE — Discharge Instructions (Signed)
Dietary Guidelines to Help Prevent Kidney Stones Your risk of kidney stones can be decreased by adjusting the foods you eat. The most important thing you can do is drink enough fluid. You should drink enough fluid to keep your urine clear or pale yellow. The following guidelines provide specific information for the type of kidney stone you have had. GUIDELINES ACCORDING TO TYPE OF KIDNEY STONE Calcium Oxalate Kidney Stones  Reduce the amount of salt you eat. Foods that have a lot of salt cause your body to release excess calcium into your urine. The excess calcium can combine with a substance called oxalate to form kidney stones.  Reduce the amount of animal protein you eat if the amount you eat is excessive. Animal protein causes your body to release excess calcium into your urine. Ask your dietitian how much protein from animal sources you should be eating.  Avoid foods that are high in oxalates. If you take vitamins, they should have less than 500 mg of vitamin C. Your body turns vitamin C into oxalates. You do not need to avoid fruits and vegetables high in vitamin C. Calcium Phosphate Kidney Stones  Reduce the amount of salt you eat to help prevent the release of excess calcium into your urine.  Reduce the amount of animal protein you eat if the amount you eat is excessive. Animal protein causes your body to release excess calcium into your urine. Ask your dietitian how much protein from animal sources you should be eating.  Get enough calcium from food or take a calcium supplement (ask your dietitian for recommendations). Food sources of calcium that do not increase your risk of kidney stones include:  Broccoli.  Dairy products, such as cheese and yogurt.  Pudding. Uric Acid Kidney Stones  Do not have more than 6 oz of animal protein per day. FOOD SOURCES Animal Protein Sources  Meat (all types).  Poultry.  Eggs.  Fish, seafood. Foods High in [employer] seasonings.  Soy  sauce.  Teriyaki sauce.  Cured and processed meats.  Salted crackers and snack foods.  Fast food.  Canned soups and most canned foods. Foods High in Oxalates  Grains:  Amaranth.  Barley.  Grits.  Wheat germ.  Bran.  Buckwheat flour.  All bran cereals.  Pretzels.  Whole wheat bread.  Vegetables:  Beans (wax).  Beets and beet greens.  Collard greens.  Eggplant.  Escarole.  Leeks.  Okra.  Parsley.  Rutabagas.  Spinach.  Swiss chard.  Tomato paste.  Fried potatoes.  Sweet potatoes.  Fruits:  Red currants.  Figs.  Kiwi.  Rhubarb.  Meat and Other Protein Sources:  Beans (dried).  Soy burgers and other soybean products.  Miso.  Nuts (peanuts, almonds, pecans, cashews, hazelnuts).  Nut butters.  Sesame seeds and tahini (paste made of sesame seeds).  Poppy seeds.  Beverages:  Chocolate drink mixes.  Soy milk.  Instant iced tea.  Juices made from high-oxalate fruits or vegetables.  Other:  Carob.  Chocolate.  Fruitcake.  Marmalades.   This information is not intended to replace advice given to you by your health care provider. Make sure you discuss any questions you have with your health care provider.   Document Released: 08/05/2010 Document Revised: 04/15/2013 Document Reviewed: 03/07/2013 Elsevier Interactive Patient Education 2016 [place] Guidelines to Help Prevent Kidney Stones Your risk of kidney stones can be decreased by adjusting the foods you eat. The most important thing you can do is drink enough  fluid. You should drink enough fluid to keep your urine clear or pale yellow. The following guidelines provide specific information for the type of kidney stone you have had. GUIDELINES ACCORDING TO TYPE OF KIDNEY STONE Calcium Oxalate Kidney Stones  Reduce the amount of salt you eat. Foods that have a lot of salt cause your body to release excess calcium into your urine. The excess  calcium can combine with a substance called oxalate to form kidney stones.  Reduce the amount of animal protein you eat if the amount you eat is excessive. Animal protein causes your body to release excess calcium into your urine. Ask your dietitian how much protein from animal sources you should be eating.  Avoid foods that are high in oxalates. If you take vitamins, they should have less than 500 mg of vitamin C. Your body turns vitamin C into oxalates. You do not need to avoid fruits and vegetables high in vitamin C. Calcium Phosphate Kidney Stones  Reduce the amount of salt you eat to help prevent the release of excess calcium into your urine.  Reduce the amount of animal protein you eat if the amount you eat is excessive. Animal protein causes your body to release excess calcium into your urine. Ask your dietitian how much protein from animal sources you should be eating.  Get enough calcium from food or take a calcium supplement (ask your dietitian for recommendations). Food sources of calcium that do not increase your risk of kidney stones include:  Broccoli.  Dairy products, such as cheese and yogurt.  Pudding. Uric Acid Kidney Stones  Do not have more than 6 oz of animal protein per day. FOOD SOURCES Animal Protein Sources  Meat (all types).  Poultry.  Eggs.  Fish, seafood. Foods High in [employer] seasonings.  Soy sauce.  Teriyaki sauce.  Cured and processed meats.  Salted crackers and snack foods.  Fast food.  Canned soups and most canned foods. Foods High in Oxalates  Grains:  Amaranth.  Barley.  Grits.  Wheat germ.  Bran.  Buckwheat flour.  All bran cereals.  Pretzels.  Whole wheat bread.  Vegetables:  Beans (wax).  Beets and beet greens.  Collard greens.  Eggplant.  Escarole.  Leeks.  Okra.  Parsley.  Rutabagas.  Spinach.  Swiss chard.  Tomato paste.  Fried potatoes.  Sweet potatoes.  Fruits:  Red  currants.  Figs.  Kiwi.  Rhubarb.  Meat and Other Protein Sources:  Beans (dried).  Soy burgers and other soybean products.  Miso.  Nuts (peanuts, almonds, pecans, cashews, hazelnuts).  Nut butters.  Sesame seeds and tahini (paste made of sesame seeds).  Poppy seeds.  Beverages:  Chocolate drink mixes.  Soy milk.  Instant iced tea.  Juices made from high-oxalate fruits or vegetables.  Other:  Carob.  Chocolate.  Fruitcake.  Marmalades.   This information is not intended to replace advice given to you by your health care provider. Make sure you discuss any questions you have with your health care provider.   Document Released: 08/05/2010 Document Revised: 04/15/2013 Document Reviewed: 03/07/2013 Elsevier Interactive Patient Education 2016 [employer] Inc[employer]  Kidney Stones Kidney stones (urolithiasis) are deposits that form inside your kidneys. The intense pain is caused by the stone moving through the urinary tract. When the stone moves, the ureter goes into spasm around the stone. The stone is usually passed in the urine.  CAUSES   A disorder that makes certain neck glands produce too much parathyroid hormone (  primary hyperparathyroidism).  A buildup of uric acid crystals, similar to gout in your joints.  Narrowing (stricture) of the ureter.  A kidney obstruction present at birth (congenital obstruction).  Previous surgery on the kidney or ureters.  Numerous kidney infections. SYMPTOMS   Feeling sick to your stomach (nauseous).  Throwing up (vomiting).  Blood in the urine (hematuria).  Pain that usually spreads (radiates) to the groin.  Frequency or urgency of urination. DIAGNOSIS   Taking a history and physical exam.  Blood or urine tests.  CT scan.  Occasionally, an examination of the inside of the urinary bladder (cystoscopy) is performed. TREATMENT   Observation.  Increasing your fluid intake.  Extracorporeal shock wave  lithotripsy--This is a noninvasive procedure that uses shock waves to break up kidney stones.  Surgery may be needed if you have severe pain or persistent obstruction. There are various surgical procedures. Most of the procedures are performed with the use of small instruments. Only small incisions are needed to accommodate these instruments, so recovery time is minimized. The size, location, and chemical composition are all important variables that will determine the proper choice of action for you. Talk to your health care provider to better understand your situation so that you will minimize the risk of injury to yourself and your kidney.  HOME CARE INSTRUCTIONS   Drink enough water and fluids to keep your urine clear or pale yellow. This will help you to pass the stone or stone fragments.  Strain all urine through the provided strainer. Keep all particulate matter and stones for your health care provider to see. The stone causing the pain may be as small as a grain of salt. It is very important to use the strainer each and every time you pass your urine. The collection of your stone will allow your health care provider to analyze it and verify that a stone has actually passed. The stone analysis will often identify what you can do to reduce the incidence of recurrences.  Only take over-the-counter or prescription medicines for pain, discomfort, or fever as directed by your health care provider.  Keep all follow-up visits as told by your health care provider. This is important.  Get follow-up X-rays if required. The absence of pain does not always mean that the stone has passed. It may have only stopped moving. If the urine remains completely obstructed, it can cause loss of kidney function or even complete destruction of the kidney. It is your responsibility to make sure X-rays and follow-ups are completed. Ultrasounds of the kidney can show blockages and the status of the kidney. Ultrasounds are  not associated with any radiation and can be performed easily in a matter of minutes.  Make changes to your daily diet as told by your health care provider. You may be told to:  Limit the amount of salt that you eat.  Eat 5 or more servings of fruits and vegetables each day.  Limit the amount of meat, poultry, fish, and eggs that you eat.  Collect a 24-hour urine sample as told by your health care provider.You may need to collect another urine sample every 6-12 months. SEEK MEDICAL CARE IF:  You experience pain that is progressive and unresponsive to any pain medicine you have been prescribed. SEEK IMMEDIATE MEDICAL CARE IF:   Pain cannot be controlled with the prescribed medicine.  You have a fever or shaking chills.  The severity or intensity of pain increases over 18 hours and is not relieved  by pain medicine.  You develop a new onset of abdominal pain.  You feel faint or pass out.  You are unable to urinate.   This information is not intended to replace advice given to you by your health care provider. Make sure you discuss any questions you have with your health care provider.   Document Released: 04/10/2005 Document Revised: 12/30/2014 Document Reviewed: 09/11/2012 Elsevier Interactive Patient Education 2016 Elsevier Inc.  Kidney Stones Kidney stones (urolithiasis) are deposits that form inside your kidneys. The intense pain is caused by the stone moving through the urinary tract. When the stone moves, the ureter goes into spasm around the stone. The stone is usually passed in the urine.  CAUSES   A disorder that makes certain neck glands produce too much parathyroid hormone (primary hyperparathyroidism).  A buildup of uric acid crystals, similar to gout in your joints.  Narrowing (stricture) of the ureter.  A kidney obstruction present at birth (congenital obstruction).  Previous surgery on the kidney or ureters.  Numerous kidney infections. SYMPTOMS    Feeling sick to your stomach (nauseous).  Throwing up (vomiting).  Blood in the urine (hematuria).  Pain that usually spreads (radiates) to the groin.  Frequency or urgency of urination. DIAGNOSIS   Taking a history and physical exam.  Blood or urine tests.  CT scan.  Occasionally, an examination of the inside of the urinary bladder (cystoscopy) is performed. TREATMENT   Observation.  Increasing your fluid intake.  Extracorporeal shock wave lithotripsy--This is a noninvasive procedure that uses shock waves to break up kidney stones.  Surgery may be needed if you have severe pain or persistent obstruction. There are various surgical procedures. Most of the procedures are performed with the use of small instruments. Only small incisions are needed to accommodate these instruments, so recovery time is minimized. The size, location, and chemical composition are all important variables that will determine the proper choice of action for you. Talk to your health care provider to better understand your situation so that you will minimize the risk of injury to yourself and your kidney.  HOME CARE INSTRUCTIONS   Drink enough water and fluids to keep your urine clear or pale yellow. This will help you to pass the stone or stone fragments.  Strain all urine through the provided strainer. Keep all particulate matter and stones for your health care provider to see. The stone causing the pain may be as small as a grain of salt. It is very important to use the strainer each and every time you pass your urine. The collection of your stone will allow your health care provider to analyze it and verify that a stone has actually passed. The stone analysis will often identify what you can do to reduce the incidence of recurrences.  Only take over-the-counter or prescription medicines for pain, discomfort, or fever as directed by your health care provider.  Keep all follow-up visits as told by your  health care provider. This is important.  Get follow-up X-rays if required. The absence of pain does not always mean that the stone has passed. It may have only stopped moving. If the urine remains completely obstructed, it can cause loss of kidney function or even complete destruction of the kidney. It is your responsibility to make sure X-rays and follow-ups are completed. Ultrasounds of the kidney can show blockages and the status of the kidney. Ultrasounds are not associated with any radiation and can be performed easily in a matter of  minutes.  Make changes to your daily diet as told by your health care provider. You may be told to:  Limit the amount of salt that you eat.  Eat 5 or more servings of fruits and vegetables each day.  Limit the amount of meat, poultry, fish, and eggs that you eat.  Collect a 24-hour urine sample as told by your health care provider.You may need to collect another urine sample every 6-12 months. SEEK MEDICAL CARE IF:  You experience pain that is progressive and unresponsive to any pain medicine you have been prescribed. SEEK IMMEDIATE MEDICAL CARE IF:   Pain cannot be controlled with the prescribed medicine.  You have a fever or shaking chills.  The severity or intensity of pain increases over 18 hours and is not relieved by pain medicine.  You develop a new onset of abdominal pain.  You feel faint or pass out.  You are unable to urinate.   This information is not intended to replace advice given to you by your health care provider. Make sure you discuss any questions you have with your health care provider.   Document Released: 04/10/2005 Document Revised: 12/30/2014 Document Reviewed: 09/11/2012 Elsevier Interactive Patient Education 2016 [employer]  Lithotripsy, Care After Refer to this sheet in the next few weeks. These instructions provide you with information on caring for yourself after your procedure. Your health care provider may  also give you more specific instructions. Your treatment has been planned according to current medical practices, but problems sometimes occur. Call your health care provider if you have any problems or questions after your procedure. WHAT TO EXPECT AFTER THE PROCEDURE   Your urine may have a red tinge for a few days after treatment. Blood loss is usually minimal.  You may have soreness in the back or flank area. This usually goes away after a few days. The procedure can cause blotches or bruises on the back where the pressure wave enters the skin. These marks usually cause only minimal discomfort and should disappear in a short time.  Stone fragments should begin to pass within 24 hours of treatment. However, a delayed passage is not unusual.  You may have pain, discomfort, and feel sick to your stomach (nauseated) when the crushed fragments of stone are passed down the tube from the kidney to the bladder. Stone fragments can pass soon after the procedure and may last for up to 4-8 weeks.  A small number of patients may have severe pain when stone fragments are not able to pass, which leads to an obstruction.  If your stone is greater than 1 inch (2.5 cm) in diameter or if you have multiple stones that have a combined diameter greater than 1 inch (2.5 cm), you may require more than one treatment.  If you had a stent placed prior to your procedure, you may experience some discomfort, especially during urination. You may experience the pain or discomfort in your flank or back, or you may experience a sharp pain or discomfort at the base of your penis or in your lower abdomen. The discomfort usually lasts only a few minutes after urinating. HOME CARE INSTRUCTIONS   Rest at home until you feel your energy improving.  Only take over-the-counter or prescription medicines for pain, discomfort, or fever as directed by your health care provider. Depending on the type of lithotripsy, you may need to take  antibiotics and anti-inflammatory medicines for a few days.  Drink enough water and fluids to keep  your urine clear or pale yellow. This helps "flush" your kidneys. It helps pass any remaining pieces of stone and prevents stones from coming back.  Most people can resume daily activities within 1-2 days after standard lithotripsy. It can take longer to recover from laser and percutaneous lithotripsy.  Strain all urine through the provided strainer. Keep all particulate matter and stones for your health care provider to see. The stone may be as small as a grain of salt. It is very important to use the strainer each and every time you pass your urine. Any stones that are found can be sent to a medical lab for examination.  Visit your health care provider for a follow-up appointment in a few weeks. Your doctor may remove your stent if you have one. Your health care provider will also check to see whether stone particles still remain. SEEK MEDICAL CARE IF:   Your pain is not relieved by medicine.  You have a lasting nauseous feeling.  You feel there is too much blood in the urine.  You develop persistent problems with frequent or painful urination that does not at least partially improve after 2 days following the procedure.  You have a congested cough.  You feel lightheaded.  You develop a rash or any other signs that might suggest an allergic problem.  You develop any reaction or side effects to your medicine(s). SEEK IMMEDIATE MEDICAL CARE IF:   You experience severe back or flank pain or both.  You see nothing but blood when you urinate.  You cannot pass any urine at all.  You have a fever or shaking chills.  You develop shortness of breath, difficulty breathing, or chest pain.  You develop vomiting that will not stop after 6-8 hours.  You have a fainting episode.   This information is not intended to replace advice given to you by your health care provider. Make sure you  discuss any questions you have with your health care provider.   Document Released: 04/30/2007 Document Revised: 12/30/2014 Document Reviewed: 10/24/2012 Elsevier Interactive Patient Education 2016 Elsevier Inc.  Lithotripsy, Care After Refer to this sheet in the next few weeks. These instructions provide you with information on caring for yourself after your procedure. Your health care provider may also give you more specific instructions. Your treatment has been planned according to current medical practices, but problems sometimes occur. Call your health care provider if you have any problems or questions after your procedure. WHAT TO EXPECT AFTER THE PROCEDURE   Your urine may have a red tinge for a few days after treatment. Blood loss is usually minimal.  You may have soreness in the back or flank area. This usually goes away after a few days. The procedure can cause blotches or bruises on the back where the pressure wave enters the skin. These marks usually cause only minimal discomfort and should disappear in a short time.  Stone fragments should begin to pass within 24 hours of treatment. However, a delayed passage is not unusual.  You may have pain, discomfort, and feel sick to your stomach (nauseated) when the crushed fragments of stone are passed down the tube from the kidney to the bladder. Stone fragments can pass soon after the procedure and may last for up to 4-8 weeks.  A small number of patients may have severe pain when stone fragments are not able to pass, which leads to an obstruction.  If your stone is greater than 1 inch (2.5 cm) in  diameter or if you have multiple stones that have a combined diameter greater than 1 inch (2.5 cm), you may require more than one treatment.  If you had a stent placed prior to your procedure, you may experience some discomfort, especially during urination. You may experience the pain or discomfort in your flank or back, or you may experience a  sharp pain or discomfort at the base of your penis or in your lower abdomen. The discomfort usually lasts only a few minutes after urinating. HOME CARE INSTRUCTIONS   Rest at home until you feel your energy improving.  Only take over-the-counter or prescription medicines for pain, discomfort, or fever as directed by your health care provider. Depending on the type of lithotripsy, you may need to take antibiotics and anti-inflammatory medicines for a few days.  Drink enough water and fluids to keep your urine clear or pale yellow. This helps "flush" your kidneys. It helps pass any remaining pieces of stone and prevents stones from coming back.  Most people can resume daily activities within 1-2 days after standard lithotripsy. It can take longer to recover from laser and percutaneous lithotripsy.  Strain all urine through the provided strainer. Keep all particulate matter and stones for your health care provider to see. The stone may be as small as a grain of salt. It is very important to use the strainer each and every time you pass your urine. Any stones that are found can be sent to a medical lab for examination.  Visit your health care provider for a follow-up appointment in a few weeks. Your doctor may remove your stent if you have one. Your health care provider will also check to see whether stone particles still remain. SEEK MEDICAL CARE IF:   Your pain is not relieved by medicine.  You have a lasting nauseous feeling.  You feel there is too much blood in the urine.  You develop persistent problems with frequent or painful urination that does not at least partially improve after 2 days following the procedure.  You have a congested cough.  You feel lightheaded.  You develop a rash or any other signs that might suggest an allergic problem.  You develop any reaction or side effects to your medicine(s). SEEK IMMEDIATE MEDICAL CARE IF:   You experience severe back or flank pain or  both.  You see nothing but blood when you urinate.  You cannot pass any urine at all.  You have a fever or shaking chills.  You develop shortness of breath, difficulty breathing, or chest pain.  You develop vomiting that will not stop after 6-8 hours.  You have a fainting episode.   This information is not intended to replace advice given to you by your health care provider. Make sure you discuss any questions you have with your health care provider.   Document Released: 04/30/2007 Document Revised: 12/30/2014 Document Reviewed: 10/24/2012 Elsevier Interactive Patient Education [employer]  Lithotripsy Lithotripsy is a treatment that can sometimes help eliminate kidney stones and pain that they cause. A form of lithotripsy, also known as extracorporeal shock wave lithotripsy, is a nonsurgical procedure that helps your body rid itself of the kidney stone when it is too big to pass on its own. Extracorporeal shock wave lithotripsy is a method of crushing a kidney stone with shock waves. These shock waves pass through your body and are focused on your stone. They cause the kidney stones to crumble while still in the urinary tract. It  is then easier for the smaller pieces of stone to pass in the urine. Lithotripsy usually takes about an hour. It is done in a hospital, a lithotripsy center, or a mobile unit. It usually does not require an overnight stay. Your health care provider will instruct you on preparation for the procedure. Your health care provider will tell you what to expect afterward. LET [facility] CARE PROVIDER KNOW ABOUT:  Any allergies you have.  All medicines you are taking, including vitamins, herbs, eye drops, creams, and over-the-counter medicines.  Previous problems you or members of your family have had with the use of anesthetics.  Any blood disorders you have.  Previous surgeries you have had.  Medical conditions you have. RISKS AND  COMPLICATIONS Generally, lithotripsy for kidney stones is a safe procedure. However, as with any procedure, complications can occur. Possible complications include:  Infection.  Bleeding of the kidney.  Bruising of the kidney or skin.  Obstruction of the ureter.  Failure of the stone to fragment. BEFORE THE PROCEDURE  Do not eat or drink for 6-8 hours prior to the procedure. You may, however, take the medications with a sip of water that your physician instructs you to take  Do not take aspirin or aspirin-containing products for 7 days prior to your procedure  Do not take nonsteroidal anti-inflammatory products for 7 days prior to your procedure PROCEDURE A stent (flexible tube with holes) may be placed in your ureter. The ureter is the tube that transports the urine from the kidneys to the bladder. Your health care provider may place a stent before the procedure. This will help keep urine flowing from the kidney if the fragments of the stone block the ureter. You may have an IV tube placed in one of your veins to give you fluids and medicines. These medicines may help you relax or make you sleep. During the procedure, you will lie comfortably on a fluid-filled cushion or in a warm-water bath. After an X-ray or ultrasound exam to locate your stone, shock waves are aimed at the stone. If you are awake, you may feel a tapping sensation as the shock waves pass through your body. If large stone particles remain after treatment, a second procedure may be necessary at a later date. For comfort during the test:  Relax as much as possible.  Try to remain still as much as possible.  Try to follow instructions to speed up the test.  Let your health care provider know if you are uncomfortable, anxious, or in pain. AFTER THE PROCEDURE  After surgery, you will be taken to the recovery area. A nurse will watch and check your progress. Once you're awake, stable, and taking fluids well, you will be  allowed to go home as long as there are no problems. You will also be allowed to pass your urine before discharge.You may be given antibiotics to help prevent infection. You may also be prescribed pain medicine if needed. In a week or two, your health care provider may remove your stent, if you have one. You may first have an X-ray exam to check on how successful the fragmentation of your stone has been and how much of the stone has passed. Your health care provider will check to see whether or not stone particles remain. SEEK IMMEDIATE MEDICAL CARE IF:  You develop a fever or shaking chills.  Your pain is not relieved by medicine.  You feel sick to your stomach (nauseated) and you vomit.  You  develop heavy bleeding.  You have difficulty urinating.  You start to pass your stent from your penis.   This information is not intended to replace advice given to you by your health care provider. Make sure you discuss any questions you have with your health care provider.   Document Released: 04/07/2000 Document Revised: 05/01/2014 Document Reviewed: 10/24/2012 Elsevier Interactive Patient Education [employer]

## 2015-10-18 ENCOUNTER — Ambulatory Visit: Payer: Self-pay | Admitting: Physician Assistant

## 2015-10-18 ENCOUNTER — Encounter: Payer: Self-pay | Admitting: Physician Assistant

## 2015-10-18 VITALS — BP 120/80 | HR 80 | Temp 98.7°F

## 2015-10-18 DIAGNOSIS — J309 Allergic rhinitis, unspecified: Secondary | ICD-10-CM

## 2015-10-18 NOTE — Progress Notes (Signed)
S: C/o runny nose and congestion for 3 days, no fever, chills, cp/sob, v/d; mucus is clear and thick, cough is sporadic, c/o of facial and dental pain.   Using otc meds:   O: PE: vitals wnl, perrl eomi, normocephalic, tms dull, nasal mucosa pale pink and swollen, throat injected, neck supple no lymph, lungs c t a, cv rrr, neuro intact  A:  Acute allergic sinusitis   P: drink fluids, continue regular meds , use otc meds of choice, return if not improving in 5 days, return earlier if worsening

## 2016-02-28 ENCOUNTER — Encounter: Payer: Self-pay | Admitting: Physician Assistant

## 2016-02-28 ENCOUNTER — Ambulatory Visit: Payer: Self-pay | Admitting: Physician Assistant

## 2016-02-28 VITALS — BP 148/86 | HR 92 | Temp 98.4°F

## 2016-02-28 DIAGNOSIS — J069 Acute upper respiratory infection, unspecified: Secondary | ICD-10-CM

## 2016-02-28 MED ORDER — AMOXICILLIN-POT CLAVULANATE 875-125 MG PO TABS: 1.0000 | ORAL_TABLET | Freq: Two times a day (BID) | ORAL | 0 refills | Status: DC

## 2016-02-28 NOTE — Progress Notes (Signed)
S: C/o cough and chest congestion for 3 days, no fever, chills, cp/sob, v/d; mucus was green this am but clear throughout the day, cough is sporadic,   Using otc meds: robitussin  O: PE: vitals wnl, nad, perrl eomi, normocephalic, tms dull, nasal mucosa wnl, throat injected, neck supple no lymph, lungs c t a, cv rrr, neuro intact  A:  Acute  uri   P: drink fluids, continue regular meds , use otc meds of choice, return if not improving in 5 days, return earlier if worsening , augmentin 875

## 2016-04-14 ENCOUNTER — Encounter: Payer: Self-pay | Admitting: Physician Assistant

## 2016-04-14 ENCOUNTER — Ambulatory Visit: Payer: Self-pay | Admitting: Physician Assistant

## 2016-04-14 VITALS — BP 120/86 | HR 81 | Temp 98.6°F

## 2016-04-14 DIAGNOSIS — J00 Acute nasopharyngitis [common cold]: Secondary | ICD-10-CM

## 2016-04-14 NOTE — Progress Notes (Signed)
S: C/o runny nose and congestion for 1-2 days, no fever, chills, cp/sob, v/d; mucus is clear throughout the day, cough is sporadic, some chest tightness but no wheezing  Using otc meds:  O: PE: vitals wnl, nad, perrl eomi, normocephalic, tms dull, nasal mucosa red and swollen, throat injected, neck supple no lymph, lungs c t a, cv rrr, neuro intact  A:  Acute viral uri   P: drink fluids, continue regular meds , use otc meds of choice, return if not improving in 5 days, return earlier if worsening

## 2016-05-15 ENCOUNTER — Ambulatory Visit: Payer: Self-pay | Admitting: Physician Assistant

## 2016-05-15 ENCOUNTER — Ambulatory Visit: Payer: Self-pay

## 2016-05-15 VITALS — BP 120/90 | HR 80

## 2016-05-15 DIAGNOSIS — J0100 Acute maxillary sinusitis, unspecified: Secondary | ICD-10-CM

## 2016-05-16 ENCOUNTER — Ambulatory Visit: Payer: Self-pay

## 2016-05-16 NOTE — Progress Notes (Signed)
See notes per Shela CommonsJ .Stump FNP

## 2016-05-24 ENCOUNTER — Telehealth: Payer: Self-pay | Admitting: Physician Assistant

## 2016-05-24 MED ORDER — AMOXICILLIN 875 MG PO TABS: 875.0000 mg | ORAL_TABLET | Freq: Two times a day (BID) | ORAL | 0 refills | Status: DC

## 2016-05-24 NOTE — Telephone Encounter (Signed)
Pt finished zpack and is still coughing up green mucus, ?if he could get rx for amoxil as it usually works better than the zpack for him Sent rx to [facility] in [place] for amoxil

## 2016-05-24 NOTE — Telephone Encounter (Signed)
Will call in amoxil

## 2016-06-22 ENCOUNTER — Ambulatory Visit: Payer: Self-pay | Admitting: Physician Assistant

## 2016-06-22 ENCOUNTER — Other Ambulatory Visit
Admission: RE | Admit: 2016-06-22 | Discharge: 2016-06-22 | Disposition: A | Discharge status: Home / Self Care | Payer: 59 | Source: Ambulatory Visit | Attending: Physician Assistant | Admitting: Physician Assistant

## 2016-06-22 ENCOUNTER — Encounter: Payer: Self-pay | Admitting: Physician Assistant

## 2016-06-22 VITALS — BP 120/88 | HR 71 | Temp 98.3°F

## 2016-06-22 DIAGNOSIS — R109 Unspecified abdominal pain: Secondary | ICD-10-CM | POA: Diagnosis present

## 2016-06-22 DIAGNOSIS — R300 Dysuria: Secondary | ICD-10-CM

## 2016-06-22 DIAGNOSIS — R1030 Lower abdominal pain, unspecified: Secondary | ICD-10-CM

## 2016-06-22 LAB — CBC WITH DIFFERENTIAL/PLATELET
Basophils Absolute: 0 10*3/uL (ref 0–0.1)
Basophils Relative: 1 %
Eosinophils Absolute: 0.1 K/uL (ref 0–0.7)
Eosinophils Relative: 1 %
HCT: 45.6 % (ref 40.0–52.0)
Hemoglobin: 15.2 g/dL (ref 13.0–18.0)
Lymphocytes Relative: 35 %
Lymphs Abs: 2.3 10*3/uL (ref 1.0–3.6)
MCH: 26.9 pg (ref 26.0–34.0)
MCHC: 33.4 g/dL (ref 32.0–36.0)
MCV: 80.4 fL (ref 80.0–100.0)
Monocytes Absolute: 0.4 10*3/uL (ref 0.2–1.0)
Monocytes Relative: 6 %
Neutro Abs: 3.7 K/uL (ref 1.4–6.5)
Neutrophils Relative %: 57 %
Platelets: 201 10*3/uL (ref 150–440)
RBC: 5.68 MIL/uL (ref 4.40–5.90)
RDW: 14.1 % (ref 11.5–14.5)
WBC: 6.5 K/uL (ref 3.8–10.6)

## 2016-06-22 LAB — COMPREHENSIVE METABOLIC PANEL WITH GFR
ALT: 39 U/L (ref 17–63)
AST: 31 U/L (ref 15–41)
Albumin: 4.7 g/dL (ref 3.5–5.0)
Alkaline Phosphatase: 72 U/L (ref 38–126)
BUN: 15 mg/dL (ref 6–20)
CO2: 29 mmol/L (ref 22–32)
Calcium: 9.1 mg/dL (ref 8.9–10.3)
Creatinine, Ser: 1.17 mg/dL (ref 0.61–1.24)
Glucose, Bld: 90 mg/dL (ref 65–99)
Sodium: 139 mmol/L (ref 135–145)
Total Bilirubin: 1 mg/dL (ref 0.3–1.2)
Total Protein: 7.8 g/dL (ref 6.5–8.1)

## 2016-06-22 LAB — POCT URINALYSIS DIPSTICK
Bilirubin, UA: NEGATIVE
Blood, UA: NEGATIVE
Glucose, UA: NEGATIVE
Ketones, UA: NEGATIVE
Leukocytes, UA: NEGATIVE
Nitrite, UA: NEGATIVE
Protein, UA: NEGATIVE
Spec Grav, UA: 1.015
Urobilinogen, UA: 0.2
pH, UA: 7

## 2016-06-22 LAB — COMPREHENSIVE METABOLIC PANEL
Anion gap: 7 (ref 5–15)
Chloride: 103 mmol/L (ref 101–111)
GFR calc Af Amer: >60 mL/min (ref >60)
GFR calc non Af Amer: >60 mL/min (ref >60)
Potassium: 4 mmol/L (ref 3.5–5.1)

## 2016-06-22 NOTE — Progress Notes (Signed)
   Subjective:Abdominal pain    Patient ID: Justin Decker, male    DOB: 09/03/1980, 36 y.o.   MRN: [id number]  HPI Patient c/o right mid.lower abdominal pain with urinary frquency for 5 days. Denies N/V/D. Onset 2nd to physical training. Person concern due to past history of kidney stone. States pain not as severe as previous kidney stone pain. Denies flank pain or hematuria   Review of Systems Negative except for compliant.    Objective:   Physical Exam  No acute distress. Negative HSM, normoactive BS. SNTP.       Assessment & Plan:Non-specific abdominal pain  CBC/CMP. Follow up tomorrow. Will consider CT Scan if no improvement or worsen of compliant.

## 2016-06-23 ENCOUNTER — Ambulatory Visit: Payer: Self-pay | Admitting: Physician Assistant

## 2016-06-23 ENCOUNTER — Encounter: Payer: Self-pay | Admitting: Physician Assistant

## 2016-06-23 VITALS — BP 110/82 | HR 82 | Temp 98.1°F

## 2016-06-23 DIAGNOSIS — R1030 Lower abdominal pain, unspecified: Secondary | ICD-10-CM

## 2016-06-23 NOTE — Progress Notes (Signed)
   Subjective:Abdominal/Flank pain.    Patient ID: Justin Decker, male    DOB: November 17, 1980, 36 y.o.   MRN: [id number]  HPI Patient for follow up of right lower abdominal pain with no objective findings on previous. Patient concern ws for kidney stone due to his prior history. Patient states pain is resolved.   Review of Systems Negative except for compliant.    Objective:   Physical Exam Deferred. Reviewed negative lab result with patient.       Assessment & Plan:Rsolved abdominal pain.  Follow up PRN

## 2016-07-13 DIAGNOSIS — E7800 Pure hypercholesterolemia, unspecified: Secondary | ICD-10-CM | POA: Diagnosis not present

## 2016-07-13 DIAGNOSIS — I493 Ventricular premature depolarization: Secondary | ICD-10-CM | POA: Diagnosis not present

## 2016-07-13 DIAGNOSIS — Z0000 Encounter for general adult medical examination without abnormal findings: Secondary | ICD-10-CM | POA: Diagnosis not present

## 2016-07-13 DIAGNOSIS — I10 Essential (primary) hypertension: Secondary | ICD-10-CM | POA: Diagnosis not present

## 2016-07-18 ENCOUNTER — Other Ambulatory Visit
Admission: RE | Admit: 2016-07-18 | Discharge: 2016-07-18 | Disposition: A | Discharge status: Home / Self Care | Payer: 59 | Source: Ambulatory Visit | Attending: Internal Medicine | Admitting: Internal Medicine

## 2016-07-18 DIAGNOSIS — Z0189 Encounter for other specified special examinations: Secondary | ICD-10-CM | POA: Diagnosis not present

## 2016-07-18 DIAGNOSIS — Z125 Encounter for screening for malignant neoplasm of prostate: Secondary | ICD-10-CM | POA: Diagnosis not present

## 2016-07-18 DIAGNOSIS — E7800 Pure hypercholesterolemia, unspecified: Secondary | ICD-10-CM | POA: Insufficient documentation

## 2016-07-18 LAB — LIPID PANEL
Cholesterol: 209 mg/dL — ABNORMAL HIGH (ref 0–200)
HDL: 40 mg/dL — ABNORMAL LOW (ref >40)
LDL Cholesterol: 136 mg/dL — ABNORMAL HIGH (ref 0–99)
Total CHOL/HDL Ratio: 5.2 ratio
Triglycerides: 167 mg/dL — ABNORMAL HIGH (ref <150)
VLDL: 33 mg/dL (ref 0–40)

## 2016-07-18 LAB — PSA: PSA: 0.3 ng/mL (ref 0.00–4.00)

## 2016-07-18 LAB — ABO/RH: ABO/RH(D): B POS

## 2016-09-13 ENCOUNTER — Ambulatory Visit: Payer: Self-pay | Admitting: Physician Assistant

## 2016-09-13 ENCOUNTER — Encounter: Payer: Self-pay | Admitting: Physician Assistant

## 2016-09-13 VITALS — BP 140/80 | HR 83 | Temp 98.7°F

## 2016-09-13 DIAGNOSIS — J301 Allergic rhinitis due to pollen: Secondary | ICD-10-CM

## 2016-09-13 NOTE — Progress Notes (Signed)
S: C/o sinus pain and congestion for 3-5 days, no fever, chills, cp/sob, v/d; mucus is green and thick in the mornings only, cough is sporadic, c/o of facial and dental pain.   Using otc meds:   O: PE: vitals wnl, nad,  perrl eomi, normocephalic, tms dull, nasal mucosa pink and swollen, throat injected, neck supple no lymph, lungs c t a, cv rrr, neuro intact  A:  Acute allergic sinusitis   P: drink fluids, continue regular meds , use otc meds of choice, return if not improving in 5 days, return earlier if worsening , no antibiotic needed at this time

## 2016-11-29 ENCOUNTER — Ambulatory Visit: Payer: Self-pay | Admitting: Physician Assistant

## 2016-11-29 DIAGNOSIS — J301 Allergic rhinitis due to pollen: Secondary | ICD-10-CM

## 2016-11-30 NOTE — Progress Notes (Signed)
Computer sx down; late entry S: pt here for runny nose and congestion, no fever/chills, no cp/sob, is going on a trip and just wants to make sure its allergies  O: vitals wnl, nad, tms clear, nasal mucosa boggy, throat wnl neck supple no lymph, lungs c t  A, cv rrr  A: allergic rhinitis  P: otc meds

## 2017-03-12 ENCOUNTER — Encounter: Payer: Self-pay | Admitting: Physician Assistant

## 2017-03-12 ENCOUNTER — Ambulatory Visit: Payer: Self-pay | Admitting: Physician Assistant

## 2017-03-12 VITALS — BP 100/80 | HR 66 | Temp 98.4°F

## 2017-03-12 DIAGNOSIS — J0100 Acute maxillary sinusitis, unspecified: Secondary | ICD-10-CM

## 2017-03-12 MED ORDER — AMOXICILLIN 875 MG PO TABS: 875.0000 mg | ORAL_TABLET | Freq: Two times a day (BID) | ORAL | 0 refills | Status: DC

## 2017-03-12 NOTE — Progress Notes (Signed)
S: Pain for sinus pain and congestion, mucus is yellow this morning and felt quite throughout the day, denies fever chills, states he has a slight cough, denies chest pain or shortness of breath, over-the-counter medications: Claritin, xyzal  O: Vitals are normal, TMs are dull, nasal mucosa is red and swollen, throat is normal, neck is supple, no lymphadenopathy noted, lungs are clear to auscultation, heart sounds are normal  A: Sinusitis  P: Amoxicillin 875 twice a day for 10 days

## 2017-07-16 DIAGNOSIS — Z0000 Encounter for general adult medical examination without abnormal findings: Secondary | ICD-10-CM | POA: Diagnosis not present

## 2017-07-16 DIAGNOSIS — I493 Ventricular premature depolarization: Secondary | ICD-10-CM | POA: Diagnosis not present

## 2017-07-16 DIAGNOSIS — I10 Essential (primary) hypertension: Secondary | ICD-10-CM | POA: Diagnosis not present

## 2017-07-16 DIAGNOSIS — E7800 Pure hypercholesterolemia, unspecified: Secondary | ICD-10-CM | POA: Diagnosis not present

## 2017-10-04 ENCOUNTER — Ambulatory Visit (INDEPENDENT_AMBULATORY_CARE_PROVIDER_SITE_OTHER): Payer: Self-pay | Admitting: Family Medicine

## 2017-10-04 VITALS — BP 126/90 | HR 86 | Temp 98.2°F | Wt 260.0 lb

## 2017-10-04 DIAGNOSIS — J3089 Other allergic rhinitis: Secondary | ICD-10-CM

## 2017-10-04 MED ORDER — IPRATROPIUM BROMIDE 0.03 % NA SOLN: 2.0000 | Freq: Two times a day (BID) | NASAL | 0 refills | Status: DC

## 2017-10-04 NOTE — Patient Instructions (Signed)
I recommend continuing Xyzal and Mucinex as you are currently.  Start Atrovent nasal spray as directed. If no improvement, follow-up here in office.   Allergic Rhinitis, Adult Allergic rhinitis is an allergic reaction that affects the mucous membrane inside the nose. It causes sneezing, a runny or stuffy nose, and the feeling of mucus going down the back of the throat (postnasal drip). Allergic rhinitis can be mild to severe. There are two types of allergic rhinitis:  Seasonal. This type is also called hay fever. It happens only during certain seasons.  Perennial. This type can happen at any time of the year.  What are the causes? This condition happens when the body's defense system (immune system) responds to certain harmless substances called allergens as though they were germs.  Seasonal allergic rhinitis is triggered by pollen, which can come from grasses, trees, and weeds. Perennial allergic rhinitis may be caused by:  House dust mites.  Pet dander.  Mold spores.  What are the signs or symptoms? Symptoms of this condition include:  Sneezing.  Runny or stuffy nose (nasal congestion).  Postnasal drip.  Itchy nose.  Tearing of the eyes.  Trouble sleeping.  Daytime sleepiness.  How is this diagnosed? This condition may be diagnosed based on:  Your medical history.  A physical exam.  Tests to check for related conditions, such as: ? Asthma. ? Pink eye. ? Ear infection. ? Upper respiratory infection.  Tests to find out which allergens trigger your symptoms. These may include skin or blood tests.  How is this treated? There is no cure for this condition, but treatment can help control symptoms. Treatment may include:  Taking medicines that block allergy symptoms, such as antihistamines. Medicine may be given as a shot, nasal spray, or pill.  Avoiding the allergen.  Desensitization. This treatment involves getting ongoing shots until your body becomes less  sensitive to the allergen. This treatment may be done if other treatments do not help.  If taking medicine and avoiding the allergen does not work, new, stronger medicines may be prescribed.  Follow these instructions at home:  Find out what you are allergic to. Common allergens include smoke, dust, and pollen.  Avoid the things you are allergic to. These are some things you can do to help avoid allergens: ? Replace carpet with wood, tile, or vinyl flooring. Carpet can trap dander and dust. ? Do not smoke. Do not allow smoking in your home. ? Change your heating and air conditioning filter at least once a month. ? During allergy season:  Keep windows closed as much as possible.  Plan outdoor activities when pollen counts are lowest. This is usually during the evening hours.  When coming indoors, change clothing and shower before sitting on furniture or bedding.  Take over-the-counter and prescription medicines only as told by your health care provider.  Keep all follow-up visits as told by your health care provider. This is important. Contact a health care provider if:  You have a fever.  You develop a persistent cough.  You make whistling sounds when you breathe (you wheeze).  Your symptoms interfere with your normal daily activities. Get help right away if:  You have shortness of breath. Summary  This condition can be managed by taking medicines as directed and avoiding allergens.  Contact your health care provider if you develop a persistent cough or fever.  During allergy season, keep windows closed as much as possible. This information is not intended to replace advice given  to you by your health care provider. Make sure you discuss any questions you have with your health care provider. Document Released: 01/03/2001 Document Revised: 05/18/2016 Document Reviewed: 05/18/2016 Elsevier Interactive Patient Education  [employer].

## 2017-10-04 NOTE — Progress Notes (Signed)
Patient ID: Justin Decker, male    DOB: [birth year], 37 y.o.   MRN: [id number]  PCP: Marguarite Arbour, MD  Chief Complaint  Patient presents with  . URI    Subjective:  HPI Justin Decker is a 37 y.o. male presents for evaluation of URI type symptoms x 2 days. Symptoms include post nasal drainage, congestion, and itchy throat. Recently traveled back to [place] from [place] and has experienced symptoms since that time. He has attempted relief with antihistamine and guaifenesin with mild improvement.  Denies fever, cough occurs in the morning only, denies chest tightness of difficulty breathing. Social History   Socioeconomic History  . Marital status: Married    Spouse name: Not on file  . Number of children: Not on file  . Years of education: Not on file  . Highest education level: Not on file  Occupational History  . Not on file  Social Needs  . Financial resource strain: Not on file  . Food insecurity:    Worry: Not on file    Inability: Not on file  . Transportation needs:    Medical: Not on file    Non-medical: Not on file  Tobacco Use  . Smoking status: Former Smoker    Last attempt to quit: 04/24/2005    Years since quitting: 12.4  . Smokeless tobacco: Never Used  Substance and Sexual Activity  . Alcohol use: Yes    Alcohol/week: 0.0 oz    Comment: social  . Drug use: Not on file  . Sexual activity: Not on file  Lifestyle  . Physical activity:    Days per week: Not on file    Minutes per session: Not on file  . Stress: Not on file  Relationships  . Social connections:    Talks on phone: Not on file    Gets together: Not on file    Attends religious service: Not on file    Active member of club or organization: Not on file    Attends meetings of clubs or organizations: Not on file    Relationship status: Not on file  . Intimate partner violence:    Fear of current or ex partner: Not on file    Emotionally abused: Not on file    Physically abused: Not on file   Forced sexual activity: Not on file  Other Topics Concern  . Not on file  Social History Narrative  . Not on file    No family history on file.  Review of Systems Pertinent negatives listed in HPI  Patient Active Problem List   Diagnosis Date Noted  . Acid reflux 03/17/2015  . BP (high blood pressure) 03/17/2015  . Pure hypercholesterolemia 03/11/2015  . 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 Beat, premature ventricular 09/03/2013    Allergies  Allergen Reactions  . Clindamycin/Lincomycin Hives and Itching  . Moxifloxacin Itching  . Prednisone Other (See Comments)    Rapid heartbeat    Prior to Admission medications   Medication Sig Start Date End Date Taking? Authorizing Provider  dexlansoprazole (DEXILANT) 60 MG capsule TAKE ONE CAPSULE BY MOUTH EVERY DAY 11/25/14  Yes [provider]  flecainide (TAMBOCOR) 100 MG tablet Take by mouth. Reported on 10/18/2015 03/11/15  Yes [provider]  levocetirizine (XYZAL) 5 MG tablet Take by mouth. 09/07/14  Yes [provider]  Omega-3 Fatty Acids (FISH OIL) 1000 MG CAPS Take 1,000 mg by mouth daily.   Yes [provider]  amoxicillin (  AMOXIL) 875 MG tablet Take 1 tablet (875 mg total) 2 (two) times daily by mouth. Patient not taking: Reported on 10/04/2017 03/12/17   Faythe GheeFisher, Susan W, PA-C  docusate sodium (COLACE) 100 MG capsule Take 2 capsules (200 mg total) by mouth 2 (two) times daily. Patient not taking: Reported on 10/04/2017 07/01/15   Orson ApeWolff, Michael R, MD    Past Medical, Surgical Family and Social History reviewed and updated.    Objective:   Today's Vitals   10/04/17 1440  BP: 126/90  Pulse: 86  Temp: 98.2 F (36.8 C)  SpO2: 96%  Weight: 260 lb (117.9 kg)    Wt Readings from Last 3 Encounters:  10/04/17 260 lb (117.9 kg)  06/30/15 250 lb (113.4 kg)    Physical Exam  Constitutional: He appears well-developed and well-nourished.  HENT:  Right Ear: Hearing, tympanic  membrane, external ear and ear canal normal.  Left Ear: Hearing, tympanic membrane, external ear and ear canal normal.  Nose: Rhinorrhea present. No mucosal edema. Right sinus exhibits no maxillary sinus tenderness and no frontal sinus tenderness. Left sinus exhibits no maxillary sinus tenderness and no frontal sinus tenderness.  Mouth/Throat: Posterior oropharyngeal erythema present.  Cardiovascular: Normal rate, regular rhythm, normal heart sounds and intact distal pulses.  Pulmonary/Chest: Effort normal and breath sounds normal.   Assessment & Plan:  1. Allergic rhinitis due to other allergic trigger, unspecified seasonality Continue symptomatic treatment with current regimen. Will trial nasal spray as conjunctive therapy. (See medication orders)  Meds ordered this encounter  Medications  . ipratropium (ATROVENT) 0.03 % nasal spray    Sig: Place 2 sprays into both nostrils 2 (two) times daily.    Dispense:  30 mL    Refill:  0    If symptoms worsen or do not improve, return for follow-up, follow-up with PCP, or at the emergency department if severity of symptoms warrant a higher level of care.    Godfrey PickKimberly S. Tiburcio PeaHarris, MSN, FNP-C [facility]  [address]  [place], [place] [id number] [phone]

## 2018-02-28 ENCOUNTER — Ambulatory Visit
Admission: RE | Admit: 2018-02-28 | Discharge: 2018-02-28 | Disposition: A | Discharge status: Home / Self Care | Payer: PRIVATE HEALTH INSURANCE | Source: Ambulatory Visit | Attending: Family Medicine | Admitting: Family Medicine

## 2018-02-28 ENCOUNTER — Other Ambulatory Visit: Payer: Self-pay | Admitting: Family Medicine

## 2018-02-28 DIAGNOSIS — M25572 Pain in left ankle and joints of left foot: Secondary | ICD-10-CM | POA: Insufficient documentation

## 2018-02-28 DIAGNOSIS — R52 Pain, unspecified: Secondary | ICD-10-CM

## 2020-01-24 ENCOUNTER — Ambulatory Visit: Payer: Self-pay

## 2020-01-25 ENCOUNTER — Ambulatory Visit: Payer: Self-pay

## 2020-03-01 ENCOUNTER — Emergency Department
Admission: EM | Admit: 2020-03-01 | Discharge: 2020-03-01 | Disposition: A | Discharge status: Home / Self Care | Payer: 59 | Attending: Emergency Medicine | Admitting: Emergency Medicine

## 2020-03-01 ENCOUNTER — Emergency Department: Payer: 59

## 2020-03-01 ENCOUNTER — Encounter: Payer: Self-pay | Admitting: Emergency Medicine

## 2020-03-01 ENCOUNTER — Other Ambulatory Visit: Payer: Self-pay

## 2020-03-01 DIAGNOSIS — R079 Chest pain, unspecified: Secondary | ICD-10-CM | POA: Insufficient documentation

## 2020-03-01 DIAGNOSIS — R221 Localized swelling, mass and lump, neck: Secondary | ICD-10-CM | POA: Insufficient documentation

## 2020-03-01 DIAGNOSIS — M79601 Pain in right arm: Secondary | ICD-10-CM | POA: Insufficient documentation

## 2020-03-01 DIAGNOSIS — Z5321 Procedure and treatment not carried out due to patient leaving prior to being seen by health care provider: Secondary | ICD-10-CM | POA: Insufficient documentation

## 2020-03-01 DIAGNOSIS — R002 Palpitations: Secondary | ICD-10-CM | POA: Diagnosis not present

## 2020-03-01 LAB — BASIC METABOLIC PANEL
Anion gap: 9 (ref 5–15)
BUN: 9 mg/dL (ref 6–20)
CO2: 27 mmol/L (ref 22–32)
Calcium: 9.4 mg/dL (ref 8.9–10.3)
Chloride: 100 mmol/L (ref 98–111)
Creatinine, Ser: 0.82 mg/dL (ref 0.61–1.24)
GFR, Estimated: >60 mL/min (ref >60)
Glucose, Bld: 99 mg/dL (ref 70–99)
Potassium: 4.1 mmol/L (ref 3.5–5.1)
Sodium: 136 mmol/L (ref 135–145)

## 2020-03-01 LAB — CBC
HCT: 47.6 % (ref 39.0–52.0)
Hemoglobin: 16.1 g/dL (ref 13.0–17.0)
MCH: 27 pg (ref 26.0–34.0)
MCHC: 33.8 g/dL (ref 30.0–36.0)
MCV: 79.7 fL — ABNORMAL LOW (ref 80.0–100.0)
Platelets: 194 10*3/uL (ref 150–400)
RBC: 5.97 MIL/uL — ABNORMAL HIGH (ref 4.22–5.81)
RDW: 14.9 % (ref 11.5–15.5)
WBC: 8.3 10*3/uL (ref 4.0–10.5)
nRBC: 0 % (ref 0.0–0.2)

## 2020-03-01 LAB — TROPONIN I (HIGH SENSITIVITY)
Troponin I (High Sensitivity): 4 ng/L (ref <18)
Troponin I (High Sensitivity): 4 ng/L (ref <18)

## 2020-03-01 NOTE — ED Triage Notes (Signed)
Pt reports had covid at the end of September and since then has had some heart palpitations a feeling that there is a lump in his throat and this am he started with some shooting chest pain to his left chest radiating into his left arm.

## 2020-03-02 ENCOUNTER — Telehealth: Payer: Self-pay | Admitting: Emergency Medicine

## 2020-03-02 NOTE — Telephone Encounter (Signed)
Called patient due to left emergency department before provider exam to inquire about condition and follow up plans. Says he is doing okay.

## 2020-04-27 ENCOUNTER — Emergency Department: Payer: 59

## 2020-04-27 ENCOUNTER — Emergency Department
Admission: EM | Admit: 2020-04-27 | Discharge: 2020-04-27 | Disposition: A | Discharge status: Home / Self Care | Payer: 59 | Attending: Student in an Organized Health Care Education/Training Program | Admitting: Student in an Organized Health Care Education/Training Program

## 2020-04-27 ENCOUNTER — Other Ambulatory Visit: Payer: Self-pay

## 2020-04-27 DIAGNOSIS — Z87891 Personal history of nicotine dependence: Secondary | ICD-10-CM | POA: Diagnosis not present

## 2020-04-27 DIAGNOSIS — N200 Calculus of kidney: Secondary | ICD-10-CM | POA: Diagnosis not present

## 2020-04-27 DIAGNOSIS — R109 Unspecified abdominal pain: Secondary | ICD-10-CM | POA: Diagnosis present

## 2020-04-27 DIAGNOSIS — R10A1 Flank pain, right side: Secondary | ICD-10-CM

## 2020-04-27 LAB — URINALYSIS, COMPLETE (UACMP) WITH MICROSCOPIC
Bilirubin Urine: NEGATIVE
Glucose, UA: NEGATIVE mg/dL
Ketones, ur: 5 mg/dL — AB
Leukocytes,Ua: NEGATIVE
Nitrite: NEGATIVE
Protein, ur: NEGATIVE mg/dL
Specific Gravity, Urine: 1.014 (ref 1.005–1.030)
Squamous Epithelial / HPF: NONE SEEN (ref 0–5)
pH: 7 (ref 5.0–8.0)

## 2020-04-27 LAB — CBC
HCT: 46.6 % (ref 39.0–52.0)
Hemoglobin: 16 g/dL (ref 13.0–17.0)
MCH: 27.7 pg (ref 26.0–34.0)
MCHC: 34.3 g/dL (ref 30.0–36.0)
MCV: 80.8 fL (ref 80.0–100.0)
Platelets: 200 10*3/uL (ref 150–400)
RBC: 5.77 MIL/uL (ref 4.22–5.81)
RDW: 13.8 % (ref 11.5–15.5)
WBC: 9 10*3/uL (ref 4.0–10.5)
nRBC: 0 % (ref 0.0–0.2)

## 2020-04-27 LAB — COMPREHENSIVE METABOLIC PANEL
ALT: 70 U/L — ABNORMAL HIGH (ref 0–44)
AST: 45 U/L — ABNORMAL HIGH (ref 15–41)
Albumin: 4.7 g/dL (ref 3.5–5.0)
Alkaline Phosphatase: 81 U/L (ref 38–126)
Anion gap: 12 (ref 5–15)
BUN: 14 mg/dL (ref 6–20)
CO2: 26 mmol/L (ref 22–32)
Calcium: 9.3 mg/dL (ref 8.9–10.3)
Chloride: 102 mmol/L (ref 98–111)
Creatinine, Ser: 1.03 mg/dL (ref 0.61–1.24)
GFR, Estimated: >60 mL/min (ref >60)
Glucose, Bld: 121 mg/dL — ABNORMAL HIGH (ref 70–99)
Potassium: 4 mmol/L (ref 3.5–5.1)
Sodium: 140 mmol/L (ref 135–145)
Total Bilirubin: 0.9 mg/dL (ref 0.3–1.2)
Total Protein: 8.1 g/dL (ref 6.5–8.1)

## 2020-04-27 LAB — LIPASE, BLOOD: Lipase: 41 U/L (ref 11–51)

## 2020-04-27 MED ORDER — SODIUM CHLORIDE 0.9 % IV BOLUS
500.0000 mL | Freq: Once | INTRAVENOUS | Status: AC
  Administered 2020-04-27: 500 mL via INTRAVENOUS

## 2020-04-27 MED ORDER — TAMSULOSIN HCL 0.4 MG PO CAPS
0.4000 mg | ORAL_CAPSULE | Freq: Every day | ORAL | 0 refills | Status: DC
Start: 2020-04-27 — End: 2020-04-29

## 2020-04-27 MED ORDER — IOHEXOL 300 MG/ML  SOLN
100.0000 mL | Freq: Once | INTRAMUSCULAR | Status: AC | PRN
  Administered 2020-04-27: 100 mL via INTRAVENOUS

## 2020-04-27 MED ORDER — KETOROLAC TROMETHAMINE 30 MG/ML IJ SOLN
15.0000 mg | Freq: Once | INTRAMUSCULAR | Status: AC
  Administered 2020-04-27: 15 mg via INTRAVENOUS
  Filled 2020-04-27: qty 1

## 2020-04-27 MED ORDER — OXYCODONE-ACETAMINOPHEN 5-325 MG PO TABS: 1.0000 | ORAL_TABLET | ORAL | 0 refills | Status: DC | PRN

## 2020-04-27 MED ORDER — OXYCODONE-ACETAMINOPHEN 5-325 MG PO TABS
1.0000 | ORAL_TABLET | Freq: Once | ORAL | Status: AC
  Administered 2020-04-27: 1 via ORAL
  Filled 2020-04-27: qty 1

## 2020-04-27 MED ORDER — HYDROMORPHONE HCL 1 MG/ML IJ SOLN: 0.5000 mg | INTRAMUSCULAR | Status: DC | PRN

## 2020-04-27 MED ORDER — ONDANSETRON HCL 4 MG/2ML IJ SOLN
4.0000 mg | Freq: Once | INTRAMUSCULAR | Status: AC
  Administered 2020-04-27: 4 mg via INTRAVENOUS
  Filled 2020-04-27: qty 2

## 2020-04-27 MED ORDER — ONDANSETRON HCL 4 MG PO TABS: 4.0000 mg | ORAL_TABLET | Freq: Every day | ORAL | 0 refills | Status: AC | PRN

## 2020-04-27 MED ORDER — MORPHINE SULFATE (PF) 4 MG/ML IV SOLN
4.0000 mg | INTRAVENOUS | Status: DC | PRN
  Administered 2020-04-27: 4 mg via INTRAVENOUS
  Filled 2020-04-27: qty 1

## 2020-04-27 NOTE — ED Provider Notes (Signed)
[facility] Emergency Department Provider Note    Event Date/Time   First MD Initiated Contact with Patient 04/27/20 1946     (approximate)  I have reviewed the triage vital signs and the nursing notes.   HISTORY  Chief Complaint Flank pain   HPI Justin Decker is a 41 y.o. male   the history of kidney stones presents to the ER for evaluation of right flank pain that started today.  Waxing and waning in nature sometimes severe associate with nausea.  No measured fevers.  Feels different than his previous kidney stones.  Has not taken anything for the pain at home.   Past Medical History:  Diagnosis Date  . GERD (gastroesophageal reflux disease)   . Seasonal allergies    No family history on file. Past Surgical History:  Procedure Laterality Date  . EXTRACORPOREAL SHOCK WAVE LITHOTRIPSY Left 07/01/2015   Procedure: EXTRACORPOREAL SHOCK WAVE LITHOTRIPSY (ESWL);  Surgeon: Royston Cowper, MD;  Location: [facility] ORS;  Service: Urology;  Laterality: Left;  . INGUINAL HERNIA REPAIR Right 1989   Patient Active Problem List   Diagnosis Date Noted  . Acid reflux 03/17/2015  . BP (high blood pressure) 03/17/2015  . Pure hypercholesterolemia 03/11/2015  . 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 Beat, premature ventricular 09/03/2013      Prior to Admission medications   Medication Sig Start Date End Date Taking? Authorizing Provider  ondansetron (ZOFRAN) 4 MG tablet Take 1 tablet (4 mg total) by mouth daily as needed. 04/27/20 04/27/21 Yes Merlyn Lot, MD  oxyCODONE-acetaminophen (PERCOCET) 5-325 MG tablet Take 1 tablet by mouth every 4 (four) hours as needed for severe pain. 04/27/20 04/27/21 Yes Merlyn Lot, MD  tamsulosin (FLOMAX) 0.4 MG CAPS capsule Take 1 capsule (0.4 mg total) by mouth daily after supper. 04/27/20  Yes Merlyn Lot, MD  amoxicillin (AMOXIL) 875 MG tablet Take 1 tablet (875 mg total) 2 (two) times daily by mouth. Patient  not taking: Reported on 10/04/2017 03/12/17   Versie Starks, PA-C  dexlansoprazole (DEXILANT) 60 MG capsule TAKE ONE CAPSULE BY MOUTH EVERY DAY 11/25/14   [provider]  docusate sodium (COLACE) 100 MG capsule Take 2 capsules (200 mg total) by mouth 2 (two) times daily. Patient not taking: Reported on 10/04/2017 07/01/15   Royston Cowper, MD  flecainide (TAMBOCOR) 100 MG tablet Take by mouth. Reported on 10/18/2015 03/11/15   [provider]  ipratropium (ATROVENT) 0.03 % nasal spray Place 2 sprays into both nostrils 2 (two) times daily. 10/04/17   Scot Jun, FNP  levocetirizine (XYZAL) 5 MG tablet Take by mouth. 09/07/14   [provider]  Omega-3 Fatty Acids (FISH OIL) 1000 MG CAPS Take 1,000 mg by mouth daily.    [provider]    Allergies Clindamycin/lincomycin, Moxifloxacin, and Prednisone    Social History Social History   Tobacco Use  . Smoking status: Former Smoker    Quit date: 04/24/2005    Years since quitting: 15.0  . Smokeless tobacco: Never Used  Substance Use Topics  . Alcohol use: Yes    Alcohol/week: 0.0 standard drinks    Comment: social    Review of Systems Patient denies headaches, rhinorrhea, blurry vision, numbness, shortness of breath, chest pain, edema, cough, abdominal pain, nausea, vomiting, diarrhea, dysuria, fevers, rashes or hallucinations unless otherwise stated above in HPI. ____________________________________________   PHYSICAL EXAM:  VITAL SIGNS: Vitals:   04/27/20 1835  BP: (!) 168/97  Pulse: 77  Resp: 16  Temp: 97.6 F (36.4 C)  SpO2: 100%    Constitutional: Alert and oriented.  Eyes: Conjunctivae are normal.  Head: Atraumatic. Nose: No congestion/rhinnorhea. Mouth/Throat: Mucous membranes are moist.   Neck: No stridor. Painless ROM.  Cardiovascular: Normal rate, regular rhythm. Grossly normal heart sounds.  Good peripheral circulation. Respiratory: Normal respiratory effort.  No  retractions. Lungs CTAB. Gastrointestinal: Soft and nontender. No distention. No abdominal bruits. + right CVA tenderness. Genitourinary:  Musculoskeletal: No lower extremity tenderness nor edema.  No joint effusions. Neurologic:  Normal speech and language. No gross focal neurologic deficits are appreciated. No facial droop Skin:  Skin is warm, dry and intact. No rash noted. Psychiatric: Mood and affect are normal. Speech and behavior are normal.  ____________________________________________   LABS (all labs ordered are listed, but only abnormal results are displayed)  Results for orders placed or performed during the hospital encounter of 04/27/20 (from the past 24 hour(s))  Urinalysis, Complete w Microscopic     Status: Abnormal   Collection Time: 04/27/20  6:19 PM  Result Value Ref Range   Color, Urine YELLOW (A) YELLOW   APPearance CLOUDY (A) CLEAR   Specific Gravity, Urine 1.014 1.005 - 1.030   pH 7.0 5.0 - 8.0   Glucose, UA NEGATIVE NEGATIVE mg/dL   Hgb urine dipstick MODERATE (A) NEGATIVE   Bilirubin Urine NEGATIVE NEGATIVE   Ketones, ur 5 (A) NEGATIVE mg/dL   Protein, ur NEGATIVE NEGATIVE mg/dL   Nitrite NEGATIVE NEGATIVE   Leukocytes,Ua NEGATIVE NEGATIVE   RBC / HPF 21-50 0 - 5 RBC/hpf   WBC, UA 0-5 0 - 5 WBC/hpf   Bacteria, UA RARE (A) NONE SEEN   Squamous Epithelial / LPF NONE SEEN 0 - 5   Mucus PRESENT    Amorphous Crystal PRESENT   Lipase, blood     Status: None   Collection Time: 04/27/20  6:41 PM  Result Value Ref Range   Lipase 41 11 - 51 U/L  Comprehensive metabolic panel     Status: Abnormal   Collection Time: 04/27/20  6:41 PM  Result Value Ref Range   Sodium 140 135 - 145 mmol/L   Potassium 4.0 3.5 - 5.1 mmol/L   Chloride 102 98 - 111 mmol/L   CO2 26 22 - 32 mmol/L   Glucose, Bld 121 (H) 70 - 99 mg/dL   BUN 14 6 - 20 mg/dL   Creatinine, Ser 4.19 0.61 - 1.24 mg/dL   Calcium 9.3 8.9 - 37.9 mg/dL   Total Protein 8.1 6.5 - 8.1 g/dL   Albumin 4.7 3.5  - 5.0 g/dL   AST 45 (H) 15 - 41 U/L   ALT 70 (H) 0 - 44 U/L   Alkaline Phosphatase 81 38 - 126 U/L   Total Bilirubin 0.9 0.3 - 1.2 mg/dL   GFR, Estimated >02 >40 mL/min   Anion gap 12 5 - 15  CBC     Status: None   Collection Time: 04/27/20  6:41 PM  Result Value Ref Range   WBC 9.0 4.0 - 10.5 K/uL   RBC 5.77 4.22 - 5.81 MIL/uL   Hemoglobin 16.0 13.0 - 17.0 g/dL   HCT 97.3 53.2 - 99.2 %   MCV 80.8 80.0 - 100.0 fL   MCH 27.7 26.0 - 34.0 pg   MCHC 34.3 30.0 - 36.0 g/dL   RDW 42.6 83.4 - 19.6 %   Platelets 200 150 - 400 K/uL   nRBC 0.0  0.0 - 0.2 %   ____________________________________________  ____________________________________________  RADIOLOGY  I personally reviewed all radiographic images ordered to evaluate for the above acute complaints and reviewed radiology reports and findings.  These findings were personally discussed with the patient.  Please see medical record for radiology report.  ____________________________________________   PROCEDURES  Procedure(s) performed:  Procedures    Critical Care performed: no ____________________________________________   INITIAL IMPRESSION / ASSESSMENT AND PLAN / ED COURSE  Pertinent labs & imaging results that were available during my care of the patient were reviewed by me and considered in my medical decision making (see chart for details).   DDX: Stone, appendicitis, cystitis, pyelo  Justin Decker is a 40 y.o. who presents to the ED with presentation as described above.  Afebrile mildly hypertensive uncomfortable appearing but nontoxic.  Urinalysis does not appear infected no white count.  CT imaging ordered for the but differential shows evidence of right-sided 6 mm UVJ stone.  Patient given IV narcotic medication and IV symptomatic management with IV fluids with improvement in symptoms.  Discussed case with Dr. Lonna Cobb of urology who kindly agrees to work patient into clinic tomorrow.  No indication for hospitalization  at this time.  Patient stable and appropriate for close outpatient follow-up.  Clinical Course as of 04/27/20 2051  Tue Apr 27, 2020  1951 nRBC: 0.0 [PR]    Clinical Course User Index [PR] Willy Eddy, MD    The patient was evaluated in Emergency Department today for the symptoms described in the history of present illness. He/she was evaluated in the context of the global COVID-19 pandemic, which necessitated consideration that the patient might be at risk for infection with the SARS-CoV-2 virus that causes COVID-19. Institutional protocols and algorithms that pertain to the evaluation of patients at risk for COVID-19 are in a state of rapid change based on information released by regulatory bodies including the CDC and federal and state organizations. These policies and algorithms were followed during the patient's care in the ED.  As part of my medical decision making, I reviewed the following data within the electronic MEDICAL RECORD NUMBER Nursing notes reviewed and incorporated, Labs reviewed, notes from prior ED visits and [place] Controlled Substance Database   ____________________________________________   FINAL CLINICAL IMPRESSION(S) / ED DIAGNOSES  Final diagnoses:  Kidney stone  Acute right flank pain      NEW MEDICATIONS STARTED DURING THIS VISIT:  New Prescriptions   ONDANSETRON (ZOFRAN) 4 MG TABLET    Take 1 tablet (4 mg total) by mouth daily as needed.   OXYCODONE-ACETAMINOPHEN (PERCOCET) 5-325 MG TABLET    Take 1 tablet by mouth every 4 (four) hours as needed for severe pain.   TAMSULOSIN (FLOMAX) 0.4 MG CAPS CAPSULE    Take 1 capsule (0.4 mg total) by mouth daily after supper.     Note:  This document was prepared using Dragon voice recognition software and may include unintentional dictation errors.    Willy Eddy, MD 04/27/20 2051

## 2020-04-27 NOTE — ED Triage Notes (Signed)
Pt comes via POV from home with c/o RLQ pain and vomiting. Pt states this started about 30 minutes ago. Pt states he has gotten shaky and in severe pain.

## 2020-04-28 ENCOUNTER — Other Ambulatory Visit
Admission: RE | Admit: 2020-04-28 | Discharge: 2020-04-28 | Disposition: A | Discharge status: Home / Self Care | Payer: 59 | Source: Ambulatory Visit | Attending: Urology | Admitting: Urology

## 2020-04-28 ENCOUNTER — Other Ambulatory Visit: Payer: Self-pay | Admitting: Urology

## 2020-04-28 ENCOUNTER — Ambulatory Visit (INDEPENDENT_AMBULATORY_CARE_PROVIDER_SITE_OTHER): Payer: 59 | Admitting: Urology

## 2020-04-28 ENCOUNTER — Encounter: Payer: Self-pay | Admitting: Urology

## 2020-04-28 VITALS — BP 182/104 | HR 97 | Ht 74.0 in | Wt 265.0 lb

## 2020-04-28 DIAGNOSIS — Z01812 Encounter for preprocedural laboratory examination: Secondary | ICD-10-CM | POA: Diagnosis present

## 2020-04-28 DIAGNOSIS — Z20822 Contact with and (suspected) exposure to covid-19: Secondary | ICD-10-CM | POA: Diagnosis not present

## 2020-04-28 DIAGNOSIS — N201 Calculus of ureter: Secondary | ICD-10-CM

## 2020-04-28 MED ORDER — CEPHALEXIN 250 MG PO CAPS: 500.0000 mg | ORAL_CAPSULE | ORAL | Status: DC

## 2020-04-28 NOTE — Progress Notes (Signed)
 04/28/2020 1:22 PM   Justin Decker 09/23/1980 [id number]  Referring provider: Sparks, Jeffrey D, MD [address] [facility] [place],  [place] [address]  Chief Complaint  Patient presents with  . Nephrolithiasis    HPI: 39-year-old male who presents today for further evaluation of  obstructing 6 mm right UVJ stone.  He was seen and evaluated yesterday in the emergency room with severe right flank pain.  CT scan indicated obstructing right UVJ stone.  He was previously patient of Dr. Wolf.  He last underwent a procedure in 2017, ESWL.  This procedure was effective/successful.  Notably, he was also seen in October 2021 with right flank pain at urgent care.  No imaging was performed at that time.  He continues to have right lower quadrant pain.  He is taking Percocet as needed.  No dysuria or any other urinary symptoms.  No fevers or chills.  Currently able to tolerate p.o.  He is desiring intervention for the stone.  He has a busy schedule and his wife recently had surgery and is having to also take care of her.  PMH: Past Medical History:  Diagnosis Date  . GERD (gastroesophageal reflux disease)   . Seasonal allergies     Surgical History: Past Surgical History:  Procedure Laterality Date  . EXTRACORPOREAL SHOCK WAVE LITHOTRIPSY Left 07/01/2015   Procedure: EXTRACORPOREAL SHOCK WAVE LITHOTRIPSY (ESWL);  Surgeon: Michael R Wolff, MD;  Location: [facility] ORS;  Service: Urology;  Laterality: Left;  . INGUINAL HERNIA REPAIR Right 1989    Home Medications:  Allergies as of 04/28/2020      Reactions   Clindamycin/lincomycin Hives, Itching   Moxifloxacin Itching   Prednisone Other (See Comments)   Rapid heartbeat      Medication List       Accurate as of April 28, 2020  1:22 PM. If you have any questions, ask your nurse or doctor.        amoxicillin 875 MG tablet Commonly known as: AMOXIL Take 1 tablet (875 mg total) 2 (two) times daily by mouth.    dexlansoprazole 60 MG capsule Commonly known as: DEXILANT TAKE ONE CAPSULE BY MOUTH EVERY DAY   docusate sodium 100 MG capsule Commonly known as: COLACE Take 2 capsules (200 mg total) by mouth 2 (two) times daily.   Fish Oil 1000 MG Caps Take 1,000 mg by mouth daily.   flecainide 100 MG tablet Commonly known as: TAMBOCOR Take by mouth. Reported on 10/18/2015   ipratropium 0.03 % nasal spray Commonly known as: ATROVENT Place 2 sprays into both nostrils 2 (two) times daily.   levocetirizine 5 MG tablet Commonly known as: XYZAL Take by mouth.   ondansetron 4 MG tablet Commonly known as: Zofran Take 1 tablet (4 mg total) by mouth daily as needed.   oxyCODONE-acetaminophen 5-325 MG tablet Commonly known as: Percocet Take 1 tablet by mouth every 4 (four) hours as needed for severe pain.   tamsulosin 0.4 MG Caps capsule Commonly known as: FLOMAX Take 1 capsule (0.4 mg total) by mouth daily after supper.       Allergies:  Allergies  Allergen Reactions  . Clindamycin/Lincomycin Hives and Itching  . Moxifloxacin Itching  . Prednisone Other (See Comments)    Rapid heartbeat    Family History: No family history on file.  Social History:  reports that he quit smoking about 15 years ago. He has never used smokeless tobacco. He reports current alcohol use. No history on file   for drug use.   Physical Exam: BP (!) 182/104   Pulse 97   Ht 6\' 2"  (1.88 m)   Wt 265 lb (120.2 kg)   BMI 34.02 kg/m   Constitutional:  Alert and oriented, No acute distress. HEENT: [place] AT, moist mucus membranes.  Trachea midline, no masses. Cardiovascular: No clubbing, cyanosis, or edema. Respiratory: Normal respiratory effort, no increased work of breathing. Skin: No rashes, bruises or suspicious lesions. Neurologic: Grossly intact, no focal deficits, moving all 4 extremities. Psychiatric: Normal mood and affect.  Laboratory Data: Lab Results  Component Value Date   WBC 9.0 04/27/2020    HGB 16.0 04/27/2020   HCT 46.6 04/27/2020   MCV 80.8 04/27/2020   PLT 200 04/27/2020    Lab Results  Component Value Date   CREATININE 1.03 04/27/2020    Urinalysis    Component Value Date/Time   COLORURINE YELLOW (A) 04/27/2020 1819   APPEARANCEUR CLOUDY (A) 04/27/2020 1819   LABSPEC 1.014 04/27/2020 1819   PHURINE 7.0 04/27/2020 1819   GLUCOSEU NEGATIVE 04/27/2020 1819   HGBUR MODERATE (A) 04/27/2020 1819   BILIRUBINUR NEGATIVE 04/27/2020 1819   BILIRUBINUR neg 06/22/2016 1058   KETONESUR 5 (A) 04/27/2020 1819   PROTEINUR NEGATIVE 04/27/2020 1819   UROBILINOGEN 0.2 06/22/2016 1058   NITRITE NEGATIVE 04/27/2020 1819   LEUKOCYTESUR NEGATIVE 04/27/2020 1819    Lab Results  Component Value Date   BACTERIA RARE (A) 04/27/2020    Pertinent Imaging: CT abdomen pelvis with contrast from yesterday personally reviewed, agree with radiology interpretation.  Stone is readily visible on scout imaging.   IMPRESSION: 1. Mild right-sided hydronephrosis to the level of the distal right ureter secondary to a partially obstructing 6 mm stone in the distal right ureter nearly at the right UVJ. 2. Nonobstructive left nephrolithiasis. 3. Hepatic steatosis.   Electronically Signed   By: 06/25/2020 M.D.   On: 04/27/2020 19:45   Assessment & Plan:    1. Right ureteral stone Right distal obstructing ureteral calculus, no evidence of infection  We discussed various treatment options for urolithiasis including observation with or without medical expulsive therapy, shockwave lithotripsy (SWL), ureteroscopy and laser lithotripsy with stent placement, and percutaneous nephrolithotomy.   We discussed that management is based on stone size, location, density, patient co-morbidities, and patient preference.    Stones <42mm in size have a >80% spontaneous passage rate. Data surrounding the use of tamsulosin for medical expulsive therapy is controversial, but meta analyses suggests  it is most efficacious for distal stones between 5-45mm in size. Possible side effects include dizziness/lightheadedness, and retrograde ejaculation.   SWL has a lower stone free rate in a single procedure, but also a lower complication rate compared to ureteroscopy and avoids a stent and associated stent related symptoms. Possible complications include renal hematoma, steinstrasse, and need for additional treatment. We discussed the role of his increased skin to stone distance can lead to decreased efficacy with shockwave lithotripsy.   Ureteroscopy with laser lithotripsy and stent placement has a higher stone free rate than SWL in a single procedure, however increased complication rate including possible infection, ureteral injury, bleeding, and stent related morbidity. Common stent related symptoms include dysuria, urgency/frequency, and flank pain.   After an extensive discussion of the risks and benefits of the above treatment options, the patient would like to proceed with right ESWL as he has done well with this in the past.   9m, MD  [facility] [address],  [place], [place] [address] [phone]

## 2020-04-28 NOTE — H&P (View-Only) (Signed)
04/28/2020 1:22 PM   Justin Decker [birth year] [id number]  Referring provider: Marguarite Arbour, MD [address] [facility] [place],  [place] [address]  Chief Complaint  Patient presents with  . Nephrolithiasis    HPI: 40 year old male who presents today for further evaluation of  obstructing 6 mm right UVJ stone.  He was seen and evaluated yesterday in the emergency room with severe right flank pain.  CT scan indicated obstructing right UVJ stone.  He was previously patient of Dr. Sheppard Penton.  He last underwent a procedure in 2017, ESWL.  This procedure was effective/successful.  Notably, he was also seen in October 2021 with right flank pain at urgent care.  No imaging was performed at that time.  He continues to have right lower quadrant pain.  He is taking Percocet as needed.  No dysuria or any other urinary symptoms.  No fevers or chills.  Currently able to tolerate p.o.  He is desiring intervention for the stone.  He has a busy schedule and his wife recently had surgery and is having to also take care of her.  PMH: Past Medical History:  Diagnosis Date  . GERD (gastroesophageal reflux disease)   . Seasonal allergies     Surgical History: Past Surgical History:  Procedure Laterality Date  . EXTRACORPOREAL SHOCK WAVE LITHOTRIPSY Left 07/01/2015   Procedure: EXTRACORPOREAL SHOCK WAVE LITHOTRIPSY (ESWL);  Surgeon: Orson Ape, MD;  Location: [facility] ORS;  Service: Urology;  Laterality: Left;  . INGUINAL HERNIA REPAIR Right 1989    Home Medications:  Allergies as of 04/28/2020      Reactions   Clindamycin/lincomycin Hives, Itching   Moxifloxacin Itching   Prednisone Other (See Comments)   Rapid heartbeat      Medication List       Accurate as of April 28, 2020  1:22 PM. If you have any questions, ask your nurse or doctor.        amoxicillin 875 MG tablet Commonly known as: AMOXIL Take 1 tablet (875 mg total) 2 (two) times daily by mouth.    dexlansoprazole 60 MG capsule Commonly known as: DEXILANT TAKE ONE CAPSULE BY MOUTH EVERY DAY   docusate sodium 100 MG capsule Commonly known as: COLACE Take 2 capsules (200 mg total) by mouth 2 (two) times daily.   Fish Oil 1000 MG Caps Take 1,000 mg by mouth daily.   flecainide 100 MG tablet Commonly known as: TAMBOCOR Take by mouth. Reported on 10/18/2015   ipratropium 0.03 % nasal spray Commonly known as: ATROVENT Place 2 sprays into both nostrils 2 (two) times daily.   levocetirizine 5 MG tablet Commonly known as: XYZAL Take by mouth.   ondansetron 4 MG tablet Commonly known as: Zofran Take 1 tablet (4 mg total) by mouth daily as needed.   oxyCODONE-acetaminophen 5-325 MG tablet Commonly known as: Percocet Take 1 tablet by mouth every 4 (four) hours as needed for severe pain.   tamsulosin 0.4 MG Caps capsule Commonly known as: FLOMAX Take 1 capsule (0.4 mg total) by mouth daily after supper.       Allergies:  Allergies  Allergen Reactions  . Clindamycin/Lincomycin Hives and Itching  . Moxifloxacin Itching  . Prednisone Other (See Comments)    Rapid heartbeat    Family History: No family history on file.  Social History:  reports that he quit smoking about 15 years ago. He has never used smokeless tobacco. He reports current alcohol use. No history on file  for drug use.   Physical Exam: BP (!) 182/104   Pulse 97   Ht 6\' 2"  (1.88 m)   Wt 265 lb (120.2 kg)   BMI 34.02 kg/m   Constitutional:  Alert and oriented, No acute distress. HEENT: [place] AT, moist mucus membranes.  Trachea midline, no masses. Cardiovascular: No clubbing, cyanosis, or edema. Respiratory: Normal respiratory effort, no increased work of breathing. Skin: No rashes, bruises or suspicious lesions. Neurologic: Grossly intact, no focal deficits, moving all 4 extremities. Psychiatric: Normal mood and affect.  Laboratory Data: Lab Results  Component Value Date   WBC 9.0 04/27/2020    HGB 16.0 04/27/2020   HCT 46.6 04/27/2020   MCV 80.8 04/27/2020   PLT 200 04/27/2020    Lab Results  Component Value Date   CREATININE 1.03 04/27/2020    Urinalysis    Component Value Date/Time   COLORURINE YELLOW (A) 04/27/2020 1819   APPEARANCEUR CLOUDY (A) 04/27/2020 1819   LABSPEC 1.014 04/27/2020 1819   PHURINE 7.0 04/27/2020 1819   GLUCOSEU NEGATIVE 04/27/2020 1819   HGBUR MODERATE (A) 04/27/2020 1819   BILIRUBINUR NEGATIVE 04/27/2020 1819   BILIRUBINUR neg 06/22/2016 1058   KETONESUR 5 (A) 04/27/2020 1819   PROTEINUR NEGATIVE 04/27/2020 1819   UROBILINOGEN 0.2 06/22/2016 1058   NITRITE NEGATIVE 04/27/2020 1819   LEUKOCYTESUR NEGATIVE 04/27/2020 1819    Lab Results  Component Value Date   BACTERIA RARE (A) 04/27/2020    Pertinent Imaging: CT abdomen pelvis with contrast from yesterday personally reviewed, agree with radiology interpretation.  Stone is readily visible on scout imaging.   IMPRESSION: 1. Mild right-sided hydronephrosis to the level of the distal right ureter secondary to a partially obstructing 6 mm stone in the distal right ureter nearly at the right UVJ. 2. Nonobstructive left nephrolithiasis. 3. Hepatic steatosis.   Electronically Signed   By: 06/25/2020 M.D.   On: 04/27/2020 19:45   Assessment & Plan:    1. Right ureteral stone Right distal obstructing ureteral calculus, no evidence of infection  We discussed various treatment options for urolithiasis including observation with or without medical expulsive therapy, shockwave lithotripsy (SWL), ureteroscopy and laser lithotripsy with stent placement, and percutaneous nephrolithotomy.   We discussed that management is based on stone size, location, density, patient co-morbidities, and patient preference.    Stones <42mm in size have a >80% spontaneous passage rate. Data surrounding the use of tamsulosin for medical expulsive therapy is controversial, but meta analyses suggests  it is most efficacious for distal stones between 5-45mm in size. Possible side effects include dizziness/lightheadedness, and retrograde ejaculation.   SWL has a lower stone free rate in a single procedure, but also a lower complication rate compared to ureteroscopy and avoids a stent and associated stent related symptoms. Possible complications include renal hematoma, steinstrasse, and need for additional treatment. We discussed the role of his increased skin to stone distance can lead to decreased efficacy with shockwave lithotripsy.   Ureteroscopy with laser lithotripsy and stent placement has a higher stone free rate than SWL in a single procedure, however increased complication rate including possible infection, ureteral injury, bleeding, and stent related morbidity. Common stent related symptoms include dysuria, urgency/frequency, and flank pain.   After an extensive discussion of the risks and benefits of the above treatment options, the patient would like to proceed with right ESWL as he has done well with this in the past.   9m, MD  [facility] [address],  [place], [place] [address] [phone]

## 2020-04-29 ENCOUNTER — Encounter
Admission: RE | Disposition: A | Discharge status: Home / Self Care | Payer: Self-pay | Source: Home / Self Care | Attending: Urology

## 2020-04-29 ENCOUNTER — Other Ambulatory Visit: Payer: Self-pay

## 2020-04-29 ENCOUNTER — Ambulatory Visit
Admission: RE | Admit: 2020-04-29 | Discharge: 2020-04-29 | Disposition: A | Discharge status: Home / Self Care | Payer: 59 | Attending: Urology | Admitting: Urology

## 2020-04-29 ENCOUNTER — Ambulatory Visit: Payer: 59

## 2020-04-29 ENCOUNTER — Encounter: Payer: Self-pay | Admitting: Urology

## 2020-04-29 ENCOUNTER — Other Ambulatory Visit: Payer: Managed Care, Other (non HMO)

## 2020-04-29 DIAGNOSIS — Z888 Allergy status to other drugs, medicaments and biological substances status: Secondary | ICD-10-CM | POA: Diagnosis not present

## 2020-04-29 DIAGNOSIS — Z881 Allergy status to other antibiotic agents status: Secondary | ICD-10-CM | POA: Insufficient documentation

## 2020-04-29 DIAGNOSIS — N201 Calculus of ureter: Secondary | ICD-10-CM | POA: Diagnosis present

## 2020-04-29 DIAGNOSIS — N200 Calculus of kidney: Secondary | ICD-10-CM

## 2020-04-29 HISTORY — PX: EXTRACORPOREAL SHOCK WAVE LITHOTRIPSY: SHX1557

## 2020-04-29 LAB — SARS CORONAVIRUS 2 (TAT 6-24 HRS): SARS Coronavirus 2: NEGATIVE

## 2020-04-29 SURGERY — LITHOTRIPSY, ESWL
Anesthesia: Moderate Sedation | Laterality: Right

## 2020-04-29 MED ORDER — DIAZEPAM 5 MG PO TABS: 10.0000 mg | ORAL_TABLET | ORAL | Status: AC

## 2020-04-29 MED ORDER — ONDANSETRON HCL 4 MG/2ML IJ SOLN: 4.0000 mg | Freq: Once | INTRAMUSCULAR | Status: DC

## 2020-04-29 MED ORDER — DIPHENHYDRAMINE HCL 25 MG PO CAPS
ORAL_CAPSULE | ORAL | Status: AC
  Administered 2020-04-29: 25 mg via ORAL
  Filled 2020-04-29: qty 1

## 2020-04-29 MED ORDER — DIAZEPAM 5 MG PO TABS: 10.0000 mg | ORAL_TABLET | Freq: Once | ORAL | Status: DC

## 2020-04-29 MED ORDER — DIPHENHYDRAMINE HCL 25 MG PO CAPS: 25.0000 mg | ORAL_CAPSULE | Freq: Once | ORAL | Status: DC

## 2020-04-29 MED ORDER — CEPHALEXIN 500 MG PO CAPS: 500.0000 mg | ORAL_CAPSULE | Freq: Once | ORAL | Status: AC

## 2020-04-29 MED ORDER — ONDANSETRON HCL 4 MG/2ML IJ SOLN
INTRAMUSCULAR | Status: AC
  Administered 2020-04-29: 4 mg via INTRAVENOUS
  Filled 2020-04-29: qty 2

## 2020-04-29 MED ORDER — DIAZEPAM 5 MG PO TABS
ORAL_TABLET | ORAL | Status: AC
  Administered 2020-04-29: 10 mg via ORAL
  Filled 2020-04-29: qty 2

## 2020-04-29 MED ORDER — SODIUM CHLORIDE 0.9 % IV SOLN: INTRAVENOUS | Status: DC

## 2020-04-29 MED ORDER — OXYCODONE-ACETAMINOPHEN 5-325 MG PO TABS: 1.0000 | ORAL_TABLET | ORAL | 0 refills | Status: AC | PRN

## 2020-04-29 MED ORDER — CEPHALEXIN 500 MG PO CAPS
ORAL_CAPSULE | ORAL | Status: AC
  Administered 2020-04-29: 500 mg via ORAL
  Filled 2020-04-29: qty 1

## 2020-04-29 MED ORDER — DIPHENHYDRAMINE HCL 25 MG PO CAPS: 25.0000 mg | ORAL_CAPSULE | ORAL | Status: AC

## 2020-04-29 MED ORDER — TAMSULOSIN HCL 0.4 MG PO CAPS: 0.4000 mg | ORAL_CAPSULE | Freq: Every day | ORAL | 0 refills | Status: DC

## 2020-04-29 MED ORDER — ONDANSETRON HCL 4 MG/2ML IJ SOLN: 4.0000 mg | Freq: Once | INTRAMUSCULAR | Status: AC | PRN

## 2020-04-29 NOTE — Discharge Instructions (Addendum)
Per [facility] D/C instructions  Office f/u sched 1/20/2022AMBULATORY SURGERY  DISCHARGE INSTRUCTIONS   1) The drugs that you were given will stay in your system until tomorrow so for the next 24 hours you should not:  A) Drive an automobile B) Make any legal decisions C) Drink any alcoholic beverage   2) You may resume regular meals tomorrow.  Today it is better to start with liquids and gradually work up to solid foods.  You may eat anything you prefer, but it is better to start with liquids, then soup and crackers, and gradually work up to solid foods.   3) Please notify your doctor immediately if you have any unusual bleeding, trouble breathing, redness and pain at the surgery site, drainage, fever, or pain not relieved by medication.    4) Additional Instructions: Drink plenty of water.  Avoid caffeinated drink or alcohol and spicy foods or acidic foods  Please contact your physician with any problems or Same Day Surgery at [phone], Monday through Friday 6 am to 4 pm, or [place] at [facility] number at [phone].

## 2020-04-29 NOTE — OR Nursing (Signed)
Red mark on right groin.  No break in skin

## 2020-04-29 NOTE — Interval H&P Note (Signed)
History and Physical Interval Note:  04/29/2020 8:34 AM  Justin Decker  has presented today for surgery, with the diagnosis of kidney stone.  The various methods of treatment have been discussed with the patient and family. After consideration of risks, benefits and other options for treatment, the patient has consented to  Procedure(s): EXTRACORPOREAL SHOCK WAVE LITHOTRIPSY (ESWL) (Right) as a surgical intervention.  The patient's history has been reviewed, patient examined, no change in status, stable for surgery.  I have reviewed the patient's chart and labs.  Questions were answered to the patient's satisfaction.     [name] C Aliviya Schoeller

## 2020-04-30 ENCOUNTER — Encounter: Payer: Self-pay | Admitting: Urology

## 2020-05-01 ENCOUNTER — Encounter: Payer: Self-pay | Admitting: Urology

## 2020-05-03 NOTE — Telephone Encounter (Signed)
Patient called back stating he went to ER over the weekend. He has since had a bowel movement and is feeling better. He is still very fatigued and has a lack of appetite. He states he was having pain with eating and wants to know if this could be due to the ESWL. It was explained that this is not something associated post ESWL and if this continues he should follow up with his PCP for further evaluation. Patient verbalized understanding

## 2020-05-12 ENCOUNTER — Other Ambulatory Visit: Payer: Self-pay

## 2020-05-12 DIAGNOSIS — N201 Calculus of ureter: Secondary | ICD-10-CM

## 2020-05-13 ENCOUNTER — Ambulatory Visit (INDEPENDENT_AMBULATORY_CARE_PROVIDER_SITE_OTHER): Payer: 59 | Admitting: Physician Assistant

## 2020-05-13 ENCOUNTER — Ambulatory Visit
Admission: RE | Admit: 2020-05-13 | Discharge: 2020-05-13 | Disposition: A | Discharge status: Home / Self Care | Payer: 59 | Source: Ambulatory Visit | Attending: Physician Assistant | Admitting: Physician Assistant

## 2020-05-13 ENCOUNTER — Other Ambulatory Visit: Payer: Self-pay

## 2020-05-13 ENCOUNTER — Encounter: Payer: Self-pay | Admitting: Physician Assistant

## 2020-05-13 VITALS — BP 164/118 | HR 82 | Ht 74.0 in | Wt 275.0 lb

## 2020-05-13 DIAGNOSIS — Z87442 Personal history of urinary calculi: Secondary | ICD-10-CM

## 2020-05-13 DIAGNOSIS — N201 Calculus of ureter: Secondary | ICD-10-CM | POA: Insufficient documentation

## 2020-05-13 LAB — URINALYSIS, COMPLETE
Bilirubin, UA: NEGATIVE
Glucose, UA: NEGATIVE
Ketones, UA: NEGATIVE
Leukocytes,UA: NEGATIVE
Nitrite, UA: NEGATIVE
Protein,UA: NEGATIVE
RBC, UA: NEGATIVE
Specific Gravity, UA: 1.02 (ref 1.005–1.030)
Urobilinogen, Ur: 0.2 mg/dL (ref 0.2–1.0)
pH, UA: 6.5 (ref 5.0–7.5)

## 2020-05-13 LAB — MICROSCOPIC EXAMINATION: Bacteria, UA: NONE SEEN

## 2020-05-13 MED ORDER — TAMSULOSIN HCL 0.4 MG PO CAPS: 0.4000 mg | ORAL_CAPSULE | Freq: Every day | ORAL | 0 refills | Status: DC
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
## 2020-05-17 LAB — CALCULI, WITH PHOTOGRAPH (CLINICAL LAB)
Calcium Oxalate Dihydrate: 40 %
Calcium Oxalate Monohydrate: 10 %
Weight Calculi: 6 mg

## 2020-05-17 LAB — STONE ANALYSIS: Calcium Phosphate (Hydroxyl): 50 %

## 2020-06-10 ENCOUNTER — Ambulatory Visit: Payer: Self-pay | Admitting: Physician Assistant

## 2020-06-18 ENCOUNTER — Ambulatory Visit: Payer: Self-pay | Admitting: Physician Assistant

## 2023-09-21 ENCOUNTER — Other Ambulatory Visit: Payer: Self-pay | Admitting: Internal Medicine

## 2023-09-21 DIAGNOSIS — R3129 Other microscopic hematuria: Secondary | ICD-10-CM

## 2024-02-22 ENCOUNTER — Ambulatory Visit: Payer: Self-pay

## 2024-02-22 ENCOUNTER — Emergency Department (HOSPITAL_BASED_OUTPATIENT_CLINIC_OR_DEPARTMENT_OTHER): Payer: Self-pay

## 2024-02-22 ENCOUNTER — Encounter (HOSPITAL_BASED_OUTPATIENT_CLINIC_OR_DEPARTMENT_OTHER): Payer: Self-pay

## 2024-02-22 ENCOUNTER — Other Ambulatory Visit: Payer: Self-pay

## 2024-02-22 DIAGNOSIS — N201 Calculus of ureter: Secondary | ICD-10-CM | POA: Insufficient documentation

## 2024-02-22 DIAGNOSIS — N200 Calculus of kidney: Secondary | ICD-10-CM

## 2024-02-22 HISTORY — DX: Cardiac arrhythmia, unspecified: I49.9

## 2024-02-22 HISTORY — DX: Calculus of kidney: N20.0

## 2024-02-22 NOTE — ED Triage Notes (Signed)
 Pt states that he thinks that he has kidney stone. Hx of kidney stones. States that he has been dealing with it since last week. States that he went to UC this morning and got toradol this morning and pain is still bad.

## 2024-02-22 NOTE — Discharge Instructions (Addendum)
 Stay well-hydrated water.  Use Tylenol every 4 hours or ibuprofen every 6 hours needed for pain.  Follow-up with [facility] next week.  Return for fevers, uncontrolled pain, persistent vomiting. Left renal pelvic calculus measuring 7 x 13 x 9 mm  Follow-up your urine culture result on Monday to determine if you need antibiotics.

## 2024-02-22 NOTE — ED Provider Notes (Signed)
 [place] [facility] Provider Note   CSN: [id number] Arrival date & time: 02/22/24  9059     Patient presents with: Flank Pain   Welden Hausmann is a 43 y.o. male.   Patient presents with intermittent left flank pain since Monday.  Patient seen urgent care had blood in the urine sent for imaging.  Patient needed assistance from urology with lithotripsy in the past.  No fevers chills or vomiting.  Currently pain mild.  The history is provided by the patient.  Flank Pain Pertinent negatives include no chest pain, no abdominal pain, no headaches and no shortness of breath.       Prior to Admission medications   Not on File    Allergies: Clindamycin/lincomycin and Prednisone    Review of Systems  Constitutional:  Negative for chills and fever.  HENT:  Negative for congestion.   Eyes:  Negative for visual disturbance.  Respiratory:  Negative for shortness of breath.   Cardiovascular:  Negative for chest pain.  Gastrointestinal:  Negative for abdominal pain and vomiting.  Genitourinary:  Positive for flank pain and hematuria. Negative for dysuria.  Musculoskeletal:  Negative for back pain, neck pain and neck stiffness.  Skin:  Negative for rash.  Neurological:  Negative for light-headedness and headaches.    Updated Vital Signs BP (!) 160/109   Pulse 89   Temp 97.9 F (36.6 C) (Oral)   Resp 18   Ht 6' 2 (1.88 m)   Wt 122.5 kg   SpO2 98%   BMI 34.67 kg/m   Physical Exam Vitals and nursing note reviewed.  Constitutional:      General: He is not in acute distress.    Appearance: He is well-developed.  HENT:     Head: Normocephalic.     Mouth/Throat:     Mouth: Mucous membranes are moist.  Eyes:     General:        Right eye: No discharge.        Left eye: No discharge.  Neck:     Trachea: No tracheal deviation.  Cardiovascular:     Rate and Rhythm: Normal rate.  Pulmonary:     Effort: Pulmonary effort is normal.   Abdominal:     General: There is no distension.     Palpations: Abdomen is soft.     Tenderness: There is no abdominal tenderness. There is no guarding.  Musculoskeletal:     Cervical back: Normal range of motion and neck supple. No rigidity.  Skin:    General: Skin is warm.     Capillary Refill: Capillary refill takes less than 2 seconds.     Findings: No rash.  Neurological:     General: No focal deficit present.     Mental Status: He is alert.     Cranial Nerves: No cranial nerve deficit.  Psychiatric:        Mood and Affect: Mood normal.     (all labs ordered are listed, but only abnormal results are displayed) Labs Reviewed  URINALYSIS, W/ REFLEX TO CULTURE (INFECTION SUSPECTED) - Abnormal; Notable for the following components:      Result Value   Color, Urine AMBER (*)    APPearance CLOUDY (*)    Hgb urine dipstick LARGE (*)    Protein, ur 100 (*)    Bacteria, UA MANY (*)    All other components within normal limits  URINE CULTURE    EKG: None  Radiology: CT  Renal Stone Study Result Date: 02/22/2024 CLINICAL DATA:  left flank pain hematuria EXAM: CT ABDOMEN AND PELVIS WITHOUT CONTRAST TECHNIQUE: Multidetector CT imaging of the abdomen and pelvis was performed following the standard protocol without IV contrast. RADIATION DOSE REDUCTION: This exam was performed according to the departmental dose-optimization program which includes automated exposure control, adjustment of the mA and/or kV according to patient size and/or use of iterative reconstruction technique. COMPARISON:  None Available. FINDINGS: Of note, the lack of intravenous contrast limits evaluation of the solid organ parenchyma and vascularity. Lower chest: No focal airspace consolidation or pleural effusion. Hepatobiliary: No mass.Hepatic steatosis.No radiopaque stones or wall thickening of the gallbladder. No intrahepatic or extrahepatic biliary ductal dilation. Pancreas: No mass or main ductal dilation. No  peripancreatic inflammation or fluid collection. Spleen: Normal size. No mass. Adrenals/Urinary Tract: No adrenal masses. No renal mass. 7 x 13 x 9 mm calculus in the left renal pelvis. A couple of smaller left calyceal calculi are also present. The urinary bladder is completely decompressed. Stomach/Bowel: The stomach is decompressed without focal abnormality. No small bowel wall thickening or inflammation. No small bowel obstruction.Normal appendix. Sigmoid colonic diverticulosis. No changes of acute diverticulitis. Vascular/Lymphatic: No aortic aneurysm. No intraabdominal or pelvic lymphadenopathy. Reproductive: No prostatomegaly. No free pelvic fluid. Other: No pneumoperitoneum, ascites, or mesenteric inflammation. Musculoskeletal: No acute fracture or destructive lesion. Multilevel thoracic osteophytosis. IMPRESSION: 1. Left renal pelvic calculus measuring 7 x 13 x 9 mm. A couple of small nonobstructive calyceal calculi are also present. No hydronephrosis. Correlation with urinalysis recommended to exclude superimposed urinary tract infection. 2. Sigmoid diverticulosis.  No changes of acute diverticulitis. 3. Hepatic steatosis. Electronically Signed   By: Rogelia Myers M.D.   On: 02/22/2024 11:51     Procedures   Medications Ordered in the ED - No data to display                                  Medical Decision Making Amount and/or Complexity of Data Reviewed Labs: ordered. Radiology: ordered.   Patient with history of kidney stones presented to intermittent left flank pain throughout the week and hematuria.  Clinical concern for kidney stone patient has been treating it with over-the-counter meds and interested in confirming size and location to help guide follow-up.  Patient is well-hydrated and well-appearing no concern for significant renal failure or anemia or infection.  Will hold on blood work at this time.  Urinalysis reviewed independently showing hematuria, urine culture sent.   Patient has no dysuria or fevers to suggest infection.  CT scan results independent reviewed showing 9 x 13 mm left renal pelvic calculus.  Patient is able to follow-up with urology in the clinic next week.     Final diagnoses:  Kidney stone on left side  Renal calculus, left    ED Discharge Orders     None          Tonia Chew, MD 02/22/24 1208

## 2024-02-25 ENCOUNTER — Encounter: Payer: Self-pay | Admitting: Physician Assistant

## 2024-02-26 ENCOUNTER — Other Ambulatory Visit: Payer: Self-pay | Admitting: Urology

## 2024-02-28 ENCOUNTER — Encounter (HOSPITAL_COMMUNITY): Payer: Self-pay | Admitting: Urology

## 2024-02-28 NOTE — Progress Notes (Signed)
 LITHO PREOP PHONE CALL   ALLERGIES REVIEWED: YES  MEDICATION REVIEW DONE: YES MEDICATIONS THAT PT SHOULD HOLD (LIST): Ibuprofen/Nsaids 48hr, ASA 72hr  CAN PT WALK UP STAIRS WITHOUT SHORTNESS OF BREATH: YES HOME O2: NO CPAP: NO  IF YES, INFORMED PT TO BRING CPAP WITH TUBING AND MASK:YES/NO   INFORMED DRIVER NEEDED FOR PROCEDURE: YES   PT WAS GIVEN BLUE FOLDER AT UROLOGY APPT: YES PT INFORMED TO BRING BLUE FOLDER WITH ALL CONTENTS: YES  REVIEWED ARRIVAL TIME AND LOCATION: YES  OTHER PERTINENT INFORMATION:

## 2024-03-01 NOTE — H&P (Signed)
 H&P   Chief Complaint: kidney stone  History of Present Illness:   43 year old man with a history of nephrolithiasis who went to [facility] emergency department yesterday due to concern of left-sided flank pain and gross hematuria. He was found to have a 9 x 13 mm ureteral stone. He denies nausea and vomiting. He denies fevers and chills. His pain is a 6 out of 10. He is using acetaminophen . He has undergone 2 lithotripsies before in   Past Medical History:  Diagnosis Date   Abnormal heart rhythm    GERD (gastroesophageal reflux disease)    Kidney stones    Seasonal allergies    Past Surgical History:  Procedure Laterality Date   EXTRACORPOREAL SHOCK WAVE LITHOTRIPSY Left 07/01/2015   Procedure: EXTRACORPOREAL SHOCK WAVE LITHOTRIPSY (ESWL);  Surgeon: Ozell JONELLE Burkes, MD;  Location: [facility] ORS;  Service: Urology;  Laterality: Left;   EXTRACORPOREAL SHOCK WAVE LITHOTRIPSY Right 04/29/2020   Procedure: EXTRACORPOREAL SHOCK WAVE LITHOTRIPSY (ESWL);  Surgeon: Penne Knee, MD;  Location: [facility] ORS;  Service: Urology;  Laterality: Right;   INGUINAL HERNIA REPAIR Right 1989    Home Medications:  No medications prior to admission.   Allergies:  Allergies  Allergen Reactions   Clindamycin/Lincomycin Hives and Itching   Clindamycin/Lincomycin Hives   Moxifloxacin Itching   Prednisone Other (See Comments)    Rapid heartbeat   Prednisone Palpitations    Heart issues     History reviewed. No pertinent family history. Social History:  reports that he quit smoking about 18 years ago. His smoking use included cigarettes. He has never used smokeless tobacco. He reports current alcohol use. No history on file for drug use.  ROS: A complete review of systems was performed.  All systems are negative except for pertinent findings as noted. ROS   Physical Exam:  Vital signs in last 24 hours:   General: NAD Respiratory: normal WOB on RA Cards: RRR per monitor    Laboratory Data:  No results found for this or any previous visit (from the past 24 hours). Recent Results (from the past 240 hours)  Urine Culture     Status: Abnormal   Collection Time: 02/22/24  9:45 AM   Specimen: Urine, Clean Catch  Result Value Ref Range Status   Specimen Description   Final    URINE, CLEAN CATCH Performed at [facility], [address] [place], [place] [address]    Special Requests   Final    NONE Performed at [facility], [address] [place], [place] [address]    Culture (A)  Final    <10,000 COLONIES/mL INSIGNIFICANT GROWTH Performed at [facility], [address] [place], [place] [address]    Report Status 02/23/2024 FINAL  Final   Creatinine: No results for input(s): CREATININE in the last 168 hours.  Impression/Assessment:  81 M L sided nephrolithiases here fro ESWL.   We discussed risks benefits and alternatives to ESWL including bleeding, arrhythmias, failure to eliminate stone and pain. Patient voiced their understanding and consent was obtained.    Plan:  Proceed with ESWL  Steffan JAYSON Pea 03/01/2024, 7:34 PM

## 2024-03-03 ENCOUNTER — Ambulatory Visit (HOSPITAL_COMMUNITY): Payer: Self-pay

## 2024-03-03 ENCOUNTER — Ambulatory Visit (HOSPITAL_COMMUNITY)
Admission: RE | Admit: 2024-03-03 | Discharge: 2024-03-03 | Disposition: A | Discharge status: Home / Self Care | Payer: Self-pay | Attending: Urology | Admitting: Urology

## 2024-03-03 ENCOUNTER — Encounter (HOSPITAL_COMMUNITY)
Admission: RE | Disposition: A | Discharge status: Home / Self Care | Payer: Self-pay | Source: Home / Self Care | Attending: Urology

## 2024-03-03 ENCOUNTER — Other Ambulatory Visit: Payer: Self-pay | Admitting: Urology

## 2024-03-03 ENCOUNTER — Encounter (HOSPITAL_COMMUNITY): Payer: Self-pay | Admitting: Urology

## 2024-03-03 ENCOUNTER — Other Ambulatory Visit: Payer: Self-pay

## 2024-03-03 DIAGNOSIS — N200 Calculus of kidney: Secondary | ICD-10-CM | POA: Insufficient documentation

## 2024-03-03 HISTORY — PX: EXTRACORPOREAL SHOCK WAVE LITHOTRIPSY: SHX1557

## 2024-03-03 SURGERY — LITHOTRIPSY, ESWL
Anesthesia: LOCAL | Laterality: Left

## 2024-03-03 MED ORDER — CIPROFLOXACIN HCL 500 MG PO TABS
ORAL_TABLET | ORAL | Status: AC
  Filled 2024-03-03: qty 1

## 2024-03-03 MED ORDER — CIPROFLOXACIN HCL 500 MG PO TABS
500.0000 mg | ORAL_TABLET | ORAL | Status: AC
  Administered 2024-03-03: 500 mg via ORAL

## 2024-03-03 MED ORDER — DIPHENHYDRAMINE HCL 25 MG PO CAPS
25.0000 mg | ORAL_CAPSULE | ORAL | Status: AC
  Administered 2024-03-03: 25 mg via ORAL
  Filled 2024-03-03: qty 1

## 2024-03-03 MED ORDER — TAMSULOSIN HCL 0.4 MG PO CAPS: 0.4000 mg | ORAL_CAPSULE | Freq: Every day | ORAL | 0 refills | Status: DC

## 2024-03-03 MED ORDER — HYDROCODONE-ACETAMINOPHEN 5-325 MG PO TABS: 1.0000 | ORAL_TABLET | Freq: Four times a day (QID) | ORAL | 0 refills | Status: DC | PRN

## 2024-03-03 MED ORDER — DIAZEPAM 5 MG PO TABS
10.0000 mg | ORAL_TABLET | ORAL | Status: AC
  Administered 2024-03-03: 10 mg via ORAL
  Filled 2024-03-03: qty 2

## 2024-03-03 MED ORDER — SODIUM CHLORIDE 0.9 % IV SOLN: INTRAVENOUS | Status: DC

## 2024-03-03 NOTE — Op Note (Signed)
 ESWL Operative Note  Treating Physician: Steffan Pea, MD  Pre-op diagnosis: Left UPJ stone   Post-op diagnosis: Same   Procedure: Left ESWL  See Norita Dustman OP note scanned into chart. Also because of the size, density, location and other factors that cannot be anticipated I feel this will likely be a staged procedure. This fact supersedes any indication in the scanned [place] stone operative note to the contrary.  Due to high and labile blood pressure the diastolic blood pressure would not remain below 100. He was given 2 doses of hydralazine over labetalol due to low HR. Even with this intervention diastolic blood pressure did not remain low and due to risk of hematoma the procedure was stopped.   Steffan Pea, MD [facility]

## 2024-03-03 NOTE — Progress Notes (Signed)
 For Anesthesia: PCP - Reyes JONETTA Costa, MD  Cardiologist -   Bowel Prep reminder:  Chest x-ray -  EKG -  Stress Test -  ECHO -  Cardiac Cath -  Pacemaker/ICD device last checked: Pacemaker orders received: Device Rep notified:  Spinal Cord Stimulator:  Sleep Study -  CPAP -   Fasting Blood Sugar -  Checks Blood Sugar _____ times a day Date and result of last Hgb A1c-  Last dose of GLP1 agonist-  GLP1 instructions: Hold 7 days prior to schedule (Hold 24 hours-daily)   Last dose of SGLT-2 inhibitors-  SGLT-2 instructions: Hold 72 hours prior to surgery  Blood Thinner Instructions: Last Dose: Time last taken:  Aspirin Instructions: Last Dose: Time last taken:  Activity level: Can go up a flight of stairs and activities of daily living without stopping and without chest pain and/or shortness of breath   Able to exercise without chest pain and/or shortness of breath   Unable to go up a flight of stairs without chest pain and/or shortness of breath     Anesthesia review:   Patient denies shortness of breath, fever, cough and chest pain at PAT appointment   Patient verbalized understanding of instructions that were reviewed over the telephone.

## 2024-03-03 NOTE — Progress Notes (Signed)
 Litho RN Note: presented today for ESWL with [facility], denied any pain or discomfort upon arrival, Alert and Oriented x 4, gait very steady, initial NBP in Rt arm 172/112, after 15 min reassessed in Left arm 158/110, preprocedure medication of 10mg  valium  PO and 25mg  Benadryl  PO was given per orders. Was called by [facility] Team that Dr Shane had to stop ESWL due NBP readings with sedation, 20mg  Apresoline was given IV due to NPB 174/107, upon return to Phase II Recovery, vs 177/106, HR 96, RR 18/min, GCS 15, PERRLA 2 post procedure, grips strong and equal, strong plantar and flexion noted bilaterly, alert and oriented, follows commands without hesitation. Repeat VS in Phase II 156/100, Rt arm, HR 84/min, no change in neuro status. Dr Shane, MD in to speak with pt, to schedule Ureteroscopy this Thursday for stent placement and instructed to begin Flomax . Adrete Score 10 at this time, meets DC criteria. AVS provided. VSS.

## 2024-03-03 NOTE — Progress Notes (Signed)
 Meets DC criteria, Aldrete score 10, AVS reviewed with pt and girlfriend, reinforced tomorrow am appt with Primary Care for evaluation of HTN. Opportunity for questions provided prior to DC to home from the Short Stay Department

## 2024-03-03 NOTE — Discharge Instructions (Signed)
 DISCHARGE INSTRUCTIONS FOR KIDNEY STONE/ESWL  POST OP: Due to your blood pressure being too high specifically diastolic blood pressure being higher than 100 the procedure had to be stopped. Your two options are either going to your PCP to get blood pressure management then getting External Shock wave lithotripsy or getting ureteroscopy with lithotripsy.   MEDICATIONS:  1.  Hydrocodone 2. Tamsulosin    ACTIVITY:  1. No strenuous activity x 1week  2. No driving while on narcotic pain medications  3. Drink plenty of water  4. Continue to walk at home - it is normal to see blood in the urine while the stent is in place, so keep active, but don't over do it.  5. May return to work/school tomorrow or when you feel ready  6. You may experience some pain when urinating in the kidney on the side that was operated on while the stent is in place this is normal  BATHING:  1. You can shower and we recommend daily showers  2. You have a string coming from your urethra: The stent string is attached to your ureteral stent. Do not pull on this.   SIGNS/SYMPTOMS TO CALL:  Please call us  if you have a fever greater than 101.5, uncontrolled nausea/vomiting, uncontrolled pain, dizziness, unable to urinate, bloody urine with clots greater than the size of a quarter, chest pain, shortness of breath, leg swelling, leg pain, redness around wound, drainage from wound, or any other concerns or questions.   You can reach us  at [phone].   FOLLOW-UP:  1. Your follow up appointment has been attached to the discharge paper work

## 2024-03-04 ENCOUNTER — Encounter (HOSPITAL_COMMUNITY): Admission: RE | Admit: 2024-03-04 | Payer: Self-pay | Source: Ambulatory Visit

## 2024-03-04 ENCOUNTER — Encounter: Payer: Self-pay | Admitting: Family Medicine

## 2024-03-04 ENCOUNTER — Ambulatory Visit (INDEPENDENT_AMBULATORY_CARE_PROVIDER_SITE_OTHER): Payer: Self-pay | Admitting: Family Medicine

## 2024-03-04 ENCOUNTER — Other Ambulatory Visit: Payer: Self-pay

## 2024-03-04 VITALS — BP 158/105 | HR 101 | Ht 74.0 in | Wt 282.0 lb

## 2024-03-04 DIAGNOSIS — N200 Calculus of kidney: Secondary | ICD-10-CM

## 2024-03-04 DIAGNOSIS — Z0000 Encounter for general adult medical examination without abnormal findings: Secondary | ICD-10-CM

## 2024-03-04 DIAGNOSIS — I10 Essential (primary) hypertension: Secondary | ICD-10-CM

## 2024-03-04 DIAGNOSIS — R7303 Prediabetes: Secondary | ICD-10-CM | POA: Insufficient documentation

## 2024-03-04 LAB — BASIC METABOLIC PANEL WITH GFR
BUN: 13 mg/dL (ref 6–23)
CO2: 29 meq/L (ref 19–32)
Calcium: 9.6 mg/dL (ref 8.4–10.5)
Chloride: 97 meq/L (ref 96–112)
Creatinine, Ser: 1.04 mg/dL (ref 0.40–1.50)
GFR: 88.54 mL/min (ref >60.00)
Glucose, Bld: 131 mg/dL — ABNORMAL HIGH (ref 70–99)
Potassium: 4.1 meq/L (ref 3.5–5.1)
Sodium: 136 meq/L (ref 135–145)

## 2024-03-04 LAB — HEMOGLOBIN A1C: Hgb A1c MFr Bld: 6.4 % (ref 4.6–6.5)

## 2024-03-04 MED ORDER — VALSARTAN 80 MG PO TABS: 80.0000 mg | ORAL_TABLET | Freq: Every day | ORAL | 1 refills | Status: AC

## 2024-03-04 NOTE — Progress Notes (Signed)
 Sent message, via epic in basket, requesting orders in epic from [facility] adviser.

## 2024-03-04 NOTE — Assessment & Plan Note (Signed)
 Hypertension likely exacerbated by nephrolithiasis pain. Elevated blood pressure with high diastolic pressure. Valsartan chosen for its potency and renal protective effects. - Started valsartan 80 mg once daily. - Monitor blood pressure at home and record readings. - Scheduled follow-up with nurse in two weeks for blood pressure check and BMP

## 2024-03-04 NOTE — H&P (Signed)
 H&P  Chief Complaint: Left UPJ stone  History of Present Illness: 43 year old male with a history of left UPJ stone about 1.3 cm he has had significant pain has had ESWL in the past.  He did have a ESWL on 03/03/2024 but his blood pressure was too labile and diastolic pressure would not remain below 100 putting him at risk for hematoma.  Decision was made to proceed with left ureteroscopy with laser lithotripsy he is set up for that today.  Past Medical History:  Diagnosis Date   Abnormal heart rhythm    GERD (gastroesophageal reflux disease)    Kidney stones    Seasonal allergies    Past Surgical History:  Procedure Laterality Date   EXTRACORPOREAL SHOCK WAVE LITHOTRIPSY Left 07/01/2015   Procedure: EXTRACORPOREAL SHOCK WAVE LITHOTRIPSY (ESWL);  Surgeon: Ozell JONELLE Burkes, MD;  Location: [facility] ORS;  Service: Urology;  Laterality: Left;   EXTRACORPOREAL SHOCK WAVE LITHOTRIPSY Right 04/29/2020   Procedure: EXTRACORPOREAL SHOCK WAVE LITHOTRIPSY (ESWL);  Surgeon: Penne Knee, MD;  Location: [facility] ORS;  Service: Urology;  Laterality: Right;   INGUINAL HERNIA REPAIR Right 04/25/1987   NASAL SEPTUM SURGERY  2010    Home Medications:  No medications prior to admission.   Allergies:  Allergies  Allergen Reactions   Clindamycin/Lincomycin Hives and Itching   Metoprolol    Moxifloxacin Itching   Prednisone Other (See Comments)    Rapid heartbeat    Family History  Problem Relation Age of Onset   Heart attack Father    Diabetes Maternal Grandfather    Heart attack Paternal Grandfather    Social History:  reports that he quit smoking about 18 years ago. His smoking use included cigarettes. He has never used smokeless tobacco. He reports current alcohol use. He reports that he does not use drugs.  ROS: A complete review of systems was performed.  All systems are negative except for pertinent findings as noted. ROS   Physical Exam:  Vital signs in last 24 hours: Pulse Rate:   [101] 101 (11/11 0952) BP: (152-158)/(105-121) 158/105 (11/11 1019) SpO2:  [97 %] 97 % (11/11 0952) Weight:  [127.9 kg] 127.9 kg (11/11 0952) General: NAD Respiratory: normal WOB on RA Cards: RRR per monitor   Laboratory Data:  No results found for this or any previous visit (from the past 24 hours). No results found for this or any previous visit (from the past 240 hours). Creatinine: No results for input(s): CREATININE in the last 168 hours.  Impression/Assessment:  43 year old male with a history of left UPJ stone about 1.3 cm he has had significant pain has had ESWL in the past.  He did have a ESWL on 03/03/2024 but his blood pressure was too labile and diastolic pressure would not remain below 100 putting him at risk for hematoma.  Decision was made to proceed with left ureteroscopy with laser lithotripsy he is set up for that today.  We discussed risk benefits alternatives to ureteroscopy.  This included bleeding infection and damage to surrounding structures surrounding structures including ureter as well as urethra.  We discussed the need for stent postoperatively as well as the potential symptoms of stent placement.  We discussed possible inability to complete procedure due to caliber of your ureter or inability to pass stone possibly requiring long-term stent versus nephrostomy tube.  We discussed need for possible second surgery.  Patient voiced their understanding and consent was obtained.  Urine culture negative   Plan:  Proceed with left uteroscopy with  laser lithotripsy.  Steffan JAYSON Pea 03/04/2024, 12:14 PM

## 2024-03-04 NOTE — Assessment & Plan Note (Signed)
 Previous A1c of 6.4%. Lifestyle modifications effective in managing blood glucose levels. - Checked A1c today. - Continue lifestyle modifications to manage blood glucose levels.

## 2024-03-04 NOTE — Progress Notes (Signed)
 New Patient Office Visit   Subjective     Patient ID: Justin Decker, male   DOB: [birth year]  Age: 43 y.o. MRN: [id number]   CC:  Chief Complaint  Patient presents with   Establish Care      HPI Justin Decker presents to establish care. His mother is with him today.    Discussed the use of AI scribe software for clinical note transcription with the patient, who gave verbal consent to proceed.  History of Present Illness Justin Decker is a 43 year old male with a history of abnormal heart rhythm and kidney stones who presents with elevated blood pressure and ongoing kidney stone management. He is accompanied by his mother.  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 He maintains an active lifestyle, including weightlifting and playing basketball, and continues to follow up with a cardiologist.  He is currently managing a kidney stone, initially thought to be at the bottom of the ureter but later identified at the top. He has experienced previous kidney stones and has taken Flomax  in the past. He recently restarted Flomax  after a miscommunication regarding its necessity. He reports passing fragments after a recent procedure attempt, which was stopped due to high blood pressure. He experiences intermittent hematuria depending on the stone's movement.  He has a history of high blood pressure, which he has attempted to manage through diet and exercise, losing twenty pounds recently. He was previously prescribed losartan but did not take it, opting to manage his blood pressure naturally. His blood pressure remains elevated, with recent readings of 134/101. He has been diagnosed with carotid sinus syndrome, which affects his blood pressure readings during medical visits.  He has a history of gastroesophageal reflux disease (GERD) and has been on Dexilant for  several years without any issues. He also reports a history of seasonal allergies, which have resolved, and he no longer requires medication for them.  He maintains an active lifestyle, running and lifting weights regularly. 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     Hypertension: - Medications: none  - Checking BP at home: not regularly  - Denies any SOB, recurrent headaches, CP, vision changes, LE edema, dizziness, palpitations, or medication side effects. - Diet: general - Exercise: very active - Stressors: pain - kidney stone   PVC's: - Medications: flecainide  - [facility], Dr. Ammon    Kidney Stone, Left: - Following with [facility]. Recently seen in the ED for this. - Status post lithotripsy (03/03/24 procedure had to be stopped due to HTN despite hydralazine use). Planning for cystoscopy/ureteroscopy/stent later this week.       Outpatient Medications Prior to Visit  Medication Sig   dexlansoprazole (DEXILANT) 60 MG capsule TAKE ONE CAPSULE BY MOUTH EVERY DAY   flecainide (TAMBOCOR) 100 MG tablet Take 100 mg by mouth 2 (two) times daily. Reported on 10/18/2015   Multiple Vitamins-Minerals (MULTIVITAMIN WITH MINERALS) tablet Take 1 tablet by mouth daily.   Omega-3 Fatty Acids (FISH OIL) 1000 MG CAPS Take 1,000 mg by mouth daily.   tamsulosin  (FLOMAX ) 0.4 MG CAPS capsule Take 1 capsule (0.4 mg total) by mouth daily after supper.   [DISCONTINUED] cephALEXin  (KEFLEX ) 500 MG capsule Take 500 mg by mouth 2 (two) times daily.   [DISCONTINUED] HYDROcodone-acetaminophen  (NORCO/VICODIN) 5-325  MG tablet Take 1 tablet by mouth every 6 (six) hours as needed for up to 10 doses for moderate pain (pain score 4-6).   No facility-administered medications prior to visit.   Past Medical History:  Diagnosis Date   Abnormal heart rhythm    GERD (gastroesophageal reflux disease)    Kidney stones    Seasonal allergies      Past Surgical History:  Procedure Laterality Date   EXTRACORPOREAL SHOCK WAVE LITHOTRIPSY Left 07/01/2015   Procedure: EXTRACORPOREAL SHOCK WAVE LITHOTRIPSY (ESWL);  Surgeon: Ozell JONELLE Burkes, MD;  Location: [facility];  Service: Urology;  Laterality: Left;   EXTRACORPOREAL SHOCK WAVE LITHOTRIPSY Right 04/29/2020   Procedure: EXTRACORPOREAL SHOCK WAVE LITHOTRIPSY (ESWL);  Surgeon: Penne Knee, MD;  Location: [facility];  Service: Urology;  Laterality: Right;   EXTRACORPOREAL SHOCK WAVE LITHOTRIPSY Left 03/03/2024   Procedure: LITHOTRIPSY, ESWL;  Surgeon: Shane Steffan BROCKS, MD;  Location: [facility];  Service: Urology;  Laterality: Left;   INGUINAL HERNIA REPAIR Right 04/25/1987   NASAL SEPTUM SURGERY  2010     Family History  Problem Relation Age of Onset   Heart attack Father    Diabetes Maternal Grandfather    Heart attack Paternal Grandfather     Social History   Socioeconomic History   Marital status: Single    Spouse name: Not on file   Number of children: Not on file   Years of education: Not on file   Highest education level: Not on file  Occupational History   Not on file  Tobacco Use   Smoking status: Former    Current packs/day: 0.00    Types: Cigarettes    Quit date: 04/24/2005    Years since quitting: 18.8   Smokeless tobacco: Never  Substance and Sexual Activity   Alcohol use: Yes    Alcohol/week: 0.0 standard drinks of alcohol    Comment: social   Drug use: Never   Sexual activity: Not on file  Other Topics Concern   Not on file  Social History Narrative   ** Merged History Encounter **       Social Drivers of Health   Financial Resource Strain: Low Risk  (09/19/2023)   Received from [facility]   Overall Financial Resource Strain (CARDIA)    Difficulty of Paying Living Expenses: Not hard at all  Food Insecurity: No Food Insecurity (09/19/2023)   Received from [facility]   Hunger Vital Sign    Within the past  12 months, you worried that your food would run out before you got the money to buy more.: Never true    Within the past 12 months, the food you bought just didn't last and you didn't have money to get more.: Never true  Transportation Needs: No Transportation Needs (09/19/2023)   Received from [facility] - Transportation    In the past 12 months, has lack of transportation kept you from medical appointments or from getting medications?: No    Lack of Transportation (Non-Medical): No  Physical Activity: Not on file  Stress: Not on file  Social Connections: Not on file       ROS All review of systems negative except what is listed in the HPI    Objective     BP (!) 158/105   Pulse (!) 101   Ht 6' 2 (1.88 m)   Wt 282 lb (127.9 kg)   SpO2 97%   BMI 36.21  kg/m   Physical Exam Vitals reviewed.  Constitutional:      Appearance: Normal appearance. He is obese.  Cardiovascular:     Rate and Rhythm: Normal rate and regular rhythm.     Heart sounds: Normal heart sounds.  Pulmonary:     Effort: Pulmonary effort is normal.     Breath sounds: Normal breath sounds.  Abdominal:     Tenderness: There is no right CVA tenderness or left CVA tenderness.  Skin:    General: Skin is warm and dry.  Neurological:     Mental Status: He is alert and oriented to person, place, and time.  Psychiatric:        Mood and Affect: Mood normal.        Behavior: Behavior normal.        Thought Content: Thought content normal.        Judgment: Judgment normal.        Assessment & Plan:     Problem List Items Addressed This Visit       Active Problems   BP (high blood pressure) - Primary   Hypertension likely exacerbated by nephrolithiasis pain. Elevated blood pressure with high diastolic pressure. Valsartan chosen for its potency and renal protective effects. - Started valsartan 80 mg once daily. - Monitor blood pressure at home and record readings. - Scheduled  follow-up with nurse in two weeks for blood pressure check and BMP      Relevant Medications   valsartan (DIOVAN) 80 MG tablet   Other Relevant Orders   Basic metabolic panel with GFR   Prediabetes   Previous A1c of 6.4%. Lifestyle modifications effective in managing blood glucose levels. - Checked A1c today. - Continue lifestyle modifications to manage blood glucose levels.      Relevant Orders   HgB A1c   Other Visit Diagnoses       Encounter for medical examination to establish care          Kidney stone     Large kidney stone at top of ureter causing elevated blood pressure and hematuria. Scheduled for ureteroscopy later this week - Proceed with scheduled ureteroscopy - Request stone analysis post-procedure to guide dietary modifications. - Follow-up with urology               Return in about 2 weeks (around 03/18/2024) for BP check with nurse, lab appt (BMP); 3 months f/u .  Waddell KATHEE Mon, NP  I,Emily Lagle,acting as a scribe for Waddell KATHEE Mon, NP.,have documented all relevant documentation on the behalf of Waddell KATHEE Mon, NP.  I, Waddell KATHEE Mon, NP, have reviewed all documentation for this visit. The documentation on 03/04/2024 for the exam, diagnosis, procedures, and orders are all accurate and complete.

## 2024-03-05 ENCOUNTER — Ambulatory Visit: Payer: Self-pay | Admitting: Family Medicine

## 2024-03-06 ENCOUNTER — Ambulatory Visit (HOSPITAL_COMMUNITY): Payer: Self-pay

## 2024-03-06 ENCOUNTER — Encounter (HOSPITAL_COMMUNITY)
Admission: RE | Disposition: A | Discharge status: Home / Self Care | Payer: Self-pay | Source: Home / Self Care | Attending: Urology

## 2024-03-06 ENCOUNTER — Ambulatory Visit (HOSPITAL_BASED_OUTPATIENT_CLINIC_OR_DEPARTMENT_OTHER): Payer: Self-pay

## 2024-03-06 ENCOUNTER — Ambulatory Visit (HOSPITAL_COMMUNITY)
Admission: RE | Admit: 2024-03-06 | Discharge: 2024-03-06 | Disposition: A | Discharge status: Home / Self Care | Payer: Self-pay | Attending: Urology | Admitting: Urology

## 2024-03-06 DIAGNOSIS — Z87891 Personal history of nicotine dependence: Secondary | ICD-10-CM | POA: Insufficient documentation

## 2024-03-06 DIAGNOSIS — I10 Essential (primary) hypertension: Secondary | ICD-10-CM | POA: Insufficient documentation

## 2024-03-06 DIAGNOSIS — N201 Calculus of ureter: Secondary | ICD-10-CM

## 2024-03-06 DIAGNOSIS — Z01818 Encounter for other preprocedural examination: Secondary | ICD-10-CM

## 2024-03-06 DIAGNOSIS — Z79899 Other long term (current) drug therapy: Secondary | ICD-10-CM | POA: Insufficient documentation

## 2024-03-06 DIAGNOSIS — K219 Gastro-esophageal reflux disease without esophagitis: Secondary | ICD-10-CM | POA: Insufficient documentation

## 2024-03-06 HISTORY — PX: CYSTOSCOPY/URETEROSCOPY/HOLMIUM LASER/STENT PLACEMENT: SHX6546

## 2024-03-06 HISTORY — PX: CYSTOSCOPY W/ RETROGRADES: SHX1426

## 2024-03-06 LAB — CBC
HCT: 50.8 % (ref 39.0–52.0)
Hemoglobin: 17.3 g/dL — ABNORMAL HIGH (ref 13.0–17.0)
MCH: 27.6 pg (ref 26.0–34.0)
MCHC: 34.1 g/dL (ref 30.0–36.0)
MCV: 81 fL (ref 80.0–100.0)
Platelets: 188 K/uL (ref 150–400)
RBC: 6.27 MIL/uL — ABNORMAL HIGH (ref 4.22–5.81)
RDW: 13.6 % (ref 11.5–15.5)
WBC: 8 K/uL (ref 4.0–10.5)
nRBC: 0 % (ref 0.0–0.2)

## 2024-03-06 SURGERY — CYSTOSCOPY/URETEROSCOPY/HOLMIUM LASER/STENT PLACEMENT
Anesthesia: General | Laterality: Left

## 2024-03-06 MED ORDER — CHLORHEXIDINE GLUCONATE 0.12 % MT SOLN
15.0000 mL | Freq: Once | OROMUCOSAL | Status: AC
  Administered 2024-03-06: 15 mL via OROMUCOSAL

## 2024-03-06 MED ORDER — FENTANYL CITRATE (PF) 50 MCG/ML IJ SOSY
PREFILLED_SYRINGE | INTRAMUSCULAR | Status: AC
  Filled 2024-03-06: qty 1

## 2024-03-06 MED ORDER — PHENYLEPHRINE 80 MCG/ML (10ML) SYRINGE FOR IV PUSH (FOR BLOOD PRESSURE SUPPORT)
PREFILLED_SYRINGE | INTRAVENOUS | Status: AC
  Filled 2024-03-06: qty 10

## 2024-03-06 MED ORDER — CEFAZOLIN SODIUM-DEXTROSE 3-4 GM/150ML-% IV SOLN
3.0000 g | INTRAVENOUS | Status: AC
  Administered 2024-03-06: 3 g via INTRAVENOUS
  Filled 2024-03-06: qty 150

## 2024-03-06 MED ORDER — PROPOFOL 10 MG/ML IV BOLUS
INTRAVENOUS | Status: AC
Start: 2024-03-06 — End: 2024-03-06
  Filled 2024-03-06: qty 20

## 2024-03-06 MED ORDER — IOHEXOL 300 MG/ML  SOLN
INTRAMUSCULAR | Status: DC | PRN
  Administered 2024-03-06: 10 mL

## 2024-03-06 MED ORDER — MIDAZOLAM HCL 5 MG/5ML IJ SOLN
INTRAMUSCULAR | Status: DC | PRN
  Administered 2024-03-06: 2 mg via INTRAVENOUS

## 2024-03-06 MED ORDER — FENTANYL CITRATE (PF) 100 MCG/2ML IJ SOLN
INTRAMUSCULAR | Status: AC
  Filled 2024-03-06: qty 2

## 2024-03-06 MED ORDER — ONDANSETRON HCL 4 MG/2ML IJ SOLN
INTRAMUSCULAR | Status: DC | PRN
  Administered 2024-03-06: 4 mg via INTRAVENOUS

## 2024-03-06 MED ORDER — FENTANYL CITRATE (PF) 100 MCG/2ML IJ SOLN
INTRAMUSCULAR | Status: DC | PRN
  Administered 2024-03-06 (×4): 50 ug via INTRAVENOUS

## 2024-03-06 MED ORDER — TRAMADOL HCL 50 MG PO TABS: 50.0000 mg | ORAL_TABLET | Freq: Four times a day (QID) | ORAL | 0 refills | Status: DC | PRN

## 2024-03-06 MED ORDER — FENTANYL CITRATE (PF) 50 MCG/ML IJ SOSY
25.0000 ug | PREFILLED_SYRINGE | INTRAMUSCULAR | Status: DC | PRN
  Administered 2024-03-06 (×2): 50 ug via INTRAVENOUS

## 2024-03-06 MED ORDER — OXYCODONE HCL 5 MG/5ML PO SOLN: 5.0000 mg | Freq: Once | ORAL | Status: DC | PRN

## 2024-03-06 MED ORDER — ACETAMINOPHEN 500 MG PO TABS
1000.0000 mg | ORAL_TABLET | Freq: Once | ORAL | Status: AC
  Administered 2024-03-06: 1000 mg via ORAL
  Filled 2024-03-06: qty 2

## 2024-03-06 MED ORDER — MIDAZOLAM HCL 2 MG/2ML IJ SOLN
INTRAMUSCULAR | Status: AC
  Filled 2024-03-06: qty 2

## 2024-03-06 MED ORDER — PHENYLEPHRINE 80 MCG/ML (10ML) SYRINGE FOR IV PUSH (FOR BLOOD PRESSURE SUPPORT)
PREFILLED_SYRINGE | INTRAVENOUS | Status: DC | PRN
  Administered 2024-03-06: 80 ug via INTRAVENOUS

## 2024-03-06 MED ORDER — AMISULPRIDE (ANTIEMETIC) 5 MG/2ML IV SOLN: 10.0000 mg | Freq: Once | INTRAVENOUS | Status: DC | PRN

## 2024-03-06 MED ORDER — CEFAZOLIN SODIUM-DEXTROSE 2-4 GM/100ML-% IV SOLN: 2.0000 g | INTRAVENOUS | Status: DC

## 2024-03-06 MED ORDER — LIDOCAINE HCL (CARDIAC) PF 100 MG/5ML IV SOSY
PREFILLED_SYRINGE | INTRAVENOUS | Status: DC | PRN
Start: 2024-03-06 — End: 2024-03-06
  Administered 2024-03-06: 100 mg via INTRAVENOUS

## 2024-03-06 MED ORDER — ORAL CARE MOUTH RINSE: 15.0000 mL | Freq: Once | OROMUCOSAL | Status: AC

## 2024-03-06 MED ORDER — SODIUM CHLORIDE 0.9 % IR SOLN
Status: DC | PRN
  Administered 2024-03-06: 3000 mL

## 2024-03-06 MED ORDER — DEXAMETHASONE SOD PHOSPHATE PF 10 MG/ML IJ SOLN
INTRAMUSCULAR | Status: DC | PRN
  Administered 2024-03-06: 4 mg via INTRAVENOUS

## 2024-03-06 MED ORDER — HYOSCYAMINE SULFATE 0.125 MG PO TBDP: 0.1250 mg | ORAL_TABLET | Freq: Four times a day (QID) | ORAL | 0 refills | Status: DC | PRN

## 2024-03-06 MED ORDER — PHENAZOPYRIDINE HCL 200 MG PO TABS: 200.0000 mg | ORAL_TABLET | Freq: Three times a day (TID) | ORAL | 0 refills | Status: AC | PRN

## 2024-03-06 MED ORDER — PROPOFOL 10 MG/ML IV BOLUS
INTRAVENOUS | Status: DC | PRN
  Administered 2024-03-06: 200 mg via INTRAVENOUS

## 2024-03-06 MED ORDER — OXYCODONE HCL 5 MG PO TABS: 5.0000 mg | ORAL_TABLET | Freq: Once | ORAL | Status: DC | PRN

## 2024-03-06 MED ORDER — LACTATED RINGERS IV SOLN: INTRAVENOUS | Status: DC

## 2024-03-06 MED ORDER — METHOCARBAMOL 750 MG PO TABS: 750.0000 mg | ORAL_TABLET | Freq: Four times a day (QID) | ORAL | 0 refills | Status: AC

## 2024-03-06 SURGICAL SUPPLY — 24 items
BAG COUNTER SPONGE SURGICOUNT (BAG) IMPLANT
BAG URO CATCHER STRL LF (MISCELLANEOUS) ×1 IMPLANT
BASKET ZERO TIP NITINOL 2.4FR (BASKET) IMPLANT
CATH URETERAL DUAL LUMEN 10F (MISCELLANEOUS) IMPLANT
CATH URETL OPEN 5X70 (CATHETERS) IMPLANT
CLOTH BEACON ORANGE TIMEOUT ST (SAFETY) ×1 IMPLANT
EXTRACTOR STONE 1.7FRX115CM (UROLOGICAL SUPPLIES) IMPLANT
FIBER LASER MOSES 200 DFL (Laser) IMPLANT
FIBER LASER MOSES 365 DFL (Laser) IMPLANT
GLOVE BIO SURGEON STRL SZ8 (GLOVE) ×1 IMPLANT
GOWN STRL REUS W/ TWL XL LVL3 (GOWN DISPOSABLE) ×1 IMPLANT
GUIDEWIRE ANG ZIPWIRE 038X150 (WIRE) IMPLANT
GUIDEWIRE STR DUAL SENSOR (WIRE) ×1 IMPLANT
KIT TURNOVER KIT A (KITS) ×1 IMPLANT
MANIFOLD NEPTUNE II (INSTRUMENTS) ×1 IMPLANT
NS IRRIG 1000ML POUR BTL (IV SOLUTION) IMPLANT
PACK CYSTO (CUSTOM PROCEDURE TRAY) ×1 IMPLANT
SHEATH NAV HD 11/13X46 (SHEATH) IMPLANT
SHEATH NAVIGATOR HD 11/13X28 (SHEATH) IMPLANT
SHEATH NAVIGATOR HD 11/13X36 (SHEATH) ×1 IMPLANT
STENT URET 6FRX28 CONTOUR (STENTS) IMPLANT
TRACTIP FLEXIVA PULS ID 200XHI (Laser) IMPLANT
TUBING CONNECTING 10 (TUBING) ×1 IMPLANT
TUBING UROLOGY SET (TUBING) ×1 IMPLANT

## 2024-03-06 NOTE — Transfer of Care (Signed)
 Immediate Anesthesia Transfer of Care Note  Patient: Justin Decker  Procedure(s) Performed: CYSTOSCOPY/URETEROSCOPY/HOLMIUM LASER/STENT PLACEMENT (Left) CYSTOSCOPY, WITH RETROGRADE PYELOGRAM (Left)  Patient Location: PACU  Anesthesia Type:General  Level of Consciousness: awake, alert , and oriented  Airway & Oxygen Therapy: Patient Spontanous Breathing and Patient connected to face mask oxygen  Post-op Assessment: Report given to RN and Post -op Vital signs reviewed and stable  Post vital signs: Reviewed and stable  Last Vitals:  Vitals Value Taken Time  BP 150/97   Temp 97.9   Pulse 94 03/06/24 16:54  Resp 14   SpO2 97 % 03/06/24 16:54  Vitals shown include unfiled device data.  Last Pain:  Vitals:   03/06/24 1256  TempSrc: Oral  PainSc: 0-No pain         Complications: No notable events documented.

## 2024-03-06 NOTE — Anesthesia Preprocedure Evaluation (Addendum)
 Anesthesia Evaluation  Patient identified by MRN, date of birth, ID band Patient awake    Reviewed: Allergy & Precautions, NPO status , Patient's Chart, lab work & pertinent test results  Airway Mallampati: III  TM Distance: >3 FB Neck ROM: Full    Dental no notable dental hx. (+) Teeth Intact, Dental Advisory Given   Pulmonary former smoker   Pulmonary exam normal breath sounds clear to auscultation       Cardiovascular hypertension, Pt. on medications Normal cardiovascular exam Rhythm:Regular Rate:Normal     Neuro/Psych negative neurological ROS  negative psych ROS   GI/Hepatic Neg liver ROS,GERD  ,,  Endo/Other  negative endocrine ROS    Renal/GU negative Renal ROS  negative genitourinary   Musculoskeletal negative musculoskeletal ROS (+)    Abdominal   Peds  Hematology negative hematology ROS (+)   Anesthesia Other Findings   Reproductive/Obstetrics                              Anesthesia Physical Anesthesia Plan  ASA: 2  Anesthesia Plan: General   Post-op Pain Management: Tylenol  PO (pre-op)*   Induction: Intravenous  PONV Risk Score and Plan: 2 and Ondansetron , Dexamethasone and Midazolam   Airway Management Planned: LMA  Additional Equipment:   Intra-op Plan:   Post-operative Plan: Extubation in OR  Informed Consent: I have reviewed the patients History and Physical, chart, labs and discussed the procedure including the risks, benefits and alternatives for the proposed anesthesia with the patient or authorized representative who has indicated his/her understanding and acceptance.     Dental advisory given  Plan Discussed with: CRNA  Anesthesia Plan Comments:          Anesthesia Quick Evaluation

## 2024-03-06 NOTE — Discharge Instructions (Signed)
 DISCHARGE INSTRUCTIONS FOR Ureteroscopy and/or Ureteral Stent Placement  MEDICATIONS:  1.  Robaxin 2. Tamsulosin   3. Hyoscyamine  4. Pyridium   ACTIVITY:  1. No strenuous activity x 1week  2. No driving while on narcotic pain medications  3. Drink plenty of water  4. Continue to walk at home - it is normal to see blood in the urine while the stent is in place, so keep active, but don't over do it.  5. May return to work/school tomorrow or when you feel ready  6. You may experience some pain when urinating in the kidney on the side that was operated on while the stent is in place this is normal  WHAT IS NORMAL TO EXPERIENCE: It is normal to feel the urge to urinate while the stent is in place It is normal to have blood in your urine while the stent is in place  It sometimes can be normal to have pain in your kidney when you urinate   BATHING:  1. You can shower and we recommend daily showers  2. You have a string coming from your urethra: The stent string is attached to your ureteral stent. Do not pull on this until instructed   DIET: You may return to your normal diet immediately. Because of the raw surface of your bladder, alcohol, spicy foods, acid type foods and drinks with caffeine may cause irritation or frequency and should be used in moderation. To keep your urine flowing freely and to avoid constipation, drink plenty of fluids during the day ( 8-10 glasses ). Tip: Avoid cranberry juice because it is very acidic.  SIGNS/SYMPTOMS TO CALL:  Please call us  if you have a fever greater than 101.5, uncontrolled nausea/vomiting, uncontrolled pain, dizziness, unable to urinate, bloody urine with clots greater than the size of a quarter, chest pain, shortness of breath, leg swelling, leg pain, redness around wound, drainage from wound, or any other concerns or questions.   You can reach us  at [phone].   FOLLOW-UP:  1. You may remove your stent in on Monday 03/10/24. To do this go  into the shower, grab hold of the tether coming from your urethra. Pull the tether consistent motion until the stent is removed from your body. The stent will be around 10 inches long with a curl on either end.  2. You you have been set up for f/u in 6-8 weeks

## 2024-03-06 NOTE — Anesthesia Procedure Notes (Signed)
 Procedure Name: LMA Insertion Date/Time: 03/06/2024 3:46 PM  Performed by: Gladis Honey, CRNAPre-anesthesia Checklist: Patient identified, Emergency Drugs available, Suction available and Patient being monitored Patient Re-evaluated:Patient Re-evaluated prior to induction Oxygen Delivery Method: Circle System Utilized Preoxygenation: Pre-oxygenation with 100% oxygen Induction Type: IV induction Ventilation: Mask ventilation without difficulty LMA: LMA inserted LMA Size: 4.0 Number of attempts: 1 Airway Equipment and Method: Bite block Placement Confirmation: positive ETCO2 Tube secured with: Tape Dental Injury: Teeth and Oropharynx as per pre-operative assessment

## 2024-03-06 NOTE — Anesthesia Postprocedure Evaluation (Signed)
 Anesthesia Post Note  Patient: Justin Decker  Procedure(s) Performed: CYSTOSCOPY/URETEROSCOPY/HOLMIUM LASER/STENT PLACEMENT (Left) CYSTOSCOPY, WITH RETROGRADE PYELOGRAM (Left)     Patient location during evaluation: PACU Anesthesia Type: General Level of consciousness: awake and alert Pain management: pain level controlled Vital Signs Assessment: post-procedure vital signs reviewed and stable Respiratory status: spontaneous breathing, nonlabored ventilation, respiratory function stable and patient connected to nasal cannula oxygen Cardiovascular status: blood pressure returned to baseline and stable Postop Assessment: no apparent nausea or vomiting Anesthetic complications: no   No notable events documented.  Last Vitals:  Vitals:   03/06/24 1801 03/06/24 1815  BP:  (!) 148/85  Pulse:  86  Resp: 12 17  Temp:  36.5 C  SpO2:  92%    Last Pain:  Vitals:   03/06/24 1815  TempSrc:   PainSc: 3                  Siddhartha Hoback L Willamae Demby

## 2024-03-06 NOTE — Op Note (Signed)
 Preoperative diagnosis: left ureteral calculus  Postoperative diagnosis: left ureteral calculus  Procedure:  Cystoscopy left ureteroscopy and stone removal Ureteroscopic laser lithotripsy left 78F x 28 ureteral stent placement  left retrograde pyelography with interpretation  Surgeon: Steffan Pea MD.  Anesthesia: General  Complications: None  Intraoperative findings:  No tumors stones or other abnormalities within the bladder Narrow left ureteral orifice but able accommodate sheath Large stone at the UPJ dusted to size less than 2 mm. 6 x 20 double-J stent with strings placed in the left ureter  Left retrograde pyelogram interpretation: No filling defects no contrast extravasation no significant hydronephrosis of the left ureter no narrowing in the calyceal's.  EBL: Minimal  Specimens: None  Disposition of specimens: [facility] for stone analysis  Indication: Justin Decker is a 43 y.o.   patient with a left UPJ stone attended ESWL earlier this week unable to tolerate due to hypertension he chose to pursue ureteroscopy he has been set up for that today.  After reviewing the management options for treatment, the patient elected to proceed with the above surgical procedure(s). We have discussed the potential benefits and risks of the procedure, side effects of the proposed treatment, the likelihood of the patient achieving the goals of the procedure, and any potential problems that might occur during the procedure or recuperation. Informed consent has been obtained.   Description of procedure:  The patient was taken to the operating room and general anesthesia was induced.  The patient was placed in the dorsal lithotomy position, prepped and draped in the usual sterile fashion, and preoperative antibiotics were administered. A preoperative time-out was performed.   Cystourethroscopy was performed.  The patient's urethra was examined and was normal.  His  prostate was mildly obstructive no median lobe.. Pan cystoscopy was then performed. There was no evidence for any bladder tumors, stones, or other mucosal pathology.    Attention was turned to the left ureter where a sensor wire was used to intubate the left ureter upon placement was confirmed with fluoroscopy.  Then a 4.5 French semirigid ureteroscope was advanced into the left ureter to confirm that there was no stones with a previous ESWL that followed into the ureter this confirmed that there were none.  Upon reaching the UPJ another sensor wires placed into the left kidney.  Proper placement was confirmed with fluoroscopy.    A ureteral access sheath size 45cm 11-13 French was inserted over the sensor wire under fluoro to confirm proper placement at the ureteropelvic junction. The obturator and sensor wire were then removed, leaving the other wire on the outside of the sheath as a safety wire.   A flexible ureteroscopy was then advanced through the sheath and into the collecting system. Pan pyeloscopy was then performed.    The stone was then fragmented with a 365 micron holmium laser fiber on a setting of 0.5 and frequency of 25 Hz.   All stones were fragmented to sizes less than 2mm in diameter.   Pan pyeloscopy was then performed and no stones greater than 2mm were visualized.   A retrograde pyelogram was then performed confirming the findings as noted above.  The sheath was then removed leaving the safety wire in place. Visualization of the ureter on withdrawal of the sheath showed no injury to the ureter.   The 6 x 28 double-J stent was then placed over the existing wire under fluoroscopic guidance  Curl within the kidney as well as the bladder was confirmed  with fluoroscopy.  The bladder was then emptied and the procedure ended.  The patient appeared to tolerate the procedure well and without complications.  The patient was able to be awakened and transferred to the recovery unit in  satisfactory condition.   Disposition: Stent was tethered to the penis he will take it out on Monday he will plan to follow-up in 8 to 10 weeks with a renal ultrasound.

## 2024-03-07 ENCOUNTER — Encounter (HOSPITAL_COMMUNITY): Payer: Self-pay | Admitting: Urology

## 2024-03-10 ENCOUNTER — Emergency Department (HOSPITAL_COMMUNITY)
Admission: EM | Admit: 2024-03-10 | Discharge: 2024-03-10 | Disposition: A | Discharge status: Home / Self Care | Payer: Self-pay | Source: Ambulatory Visit | Attending: Emergency Medicine | Admitting: Emergency Medicine

## 2024-03-10 ENCOUNTER — Other Ambulatory Visit: Payer: Self-pay

## 2024-03-10 ENCOUNTER — Emergency Department (HOSPITAL_COMMUNITY): Payer: Self-pay

## 2024-03-10 DIAGNOSIS — R10A1 Flank pain, right side: Secondary | ICD-10-CM | POA: Insufficient documentation

## 2024-03-10 DIAGNOSIS — I10 Essential (primary) hypertension: Secondary | ICD-10-CM | POA: Insufficient documentation

## 2024-03-10 DIAGNOSIS — D72829 Elevated white blood cell count, unspecified: Secondary | ICD-10-CM | POA: Insufficient documentation

## 2024-03-10 DIAGNOSIS — R109 Unspecified abdominal pain: Secondary | ICD-10-CM | POA: Insufficient documentation

## 2024-03-10 DIAGNOSIS — R10A0 Flank pain, unspecified side: Secondary | ICD-10-CM

## 2024-03-10 LAB — COMPREHENSIVE METABOLIC PANEL WITH GFR
ALT: 37 U/L (ref 0–44)
AST: 33 U/L (ref 15–41)
Albumin: 4.5 g/dL (ref 3.5–5.0)
Alkaline Phosphatase: 107 U/L (ref 38–126)
Anion gap: 13 (ref 5–15)
BUN: 10 mg/dL (ref 6–20)
CO2: 23 mmol/L (ref 22–32)
Calcium: 9.6 mg/dL (ref 8.9–10.3)
Chloride: 98 mmol/L (ref 98–111)
Creatinine, Ser: 1.01 mg/dL (ref 0.61–1.24)
GFR, Estimated: >60 mL/min (ref >60)
Glucose, Bld: 136 mg/dL — ABNORMAL HIGH (ref 70–99)
Potassium: 3.6 mmol/L (ref 3.5–5.1)
Sodium: 134 mmol/L — ABNORMAL LOW (ref 135–145)
Total Bilirubin: 0.8 mg/dL (ref 0.0–1.2)
Total Protein: 8.1 g/dL (ref 6.5–8.1)

## 2024-03-10 LAB — URINALYSIS, ROUTINE W REFLEX MICROSCOPIC
Bacteria, UA: NONE SEEN
Bilirubin Urine: NEGATIVE
Glucose, UA: NEGATIVE mg/dL
Ketones, ur: 20 mg/dL — AB
Leukocytes,Ua: NEGATIVE
Nitrite: NEGATIVE
Protein, ur: NEGATIVE mg/dL
RBC / HPF: >50 RBC/hpf (ref 0–5)
Specific Gravity, Urine: >1.046 — ABNORMAL HIGH (ref 1.005–1.030)
pH: 6 (ref 5.0–8.0)

## 2024-03-10 LAB — CBC WITH DIFFERENTIAL/PLATELET
Abs Immature Granulocytes: 0.05 K/uL (ref 0.00–0.07)
Basophils Absolute: 0.1 K/uL (ref 0.0–0.1)
Basophils Relative: 1 %
Eosinophils Absolute: 0 K/uL (ref 0.0–0.5)
Eosinophils Relative: 0 %
HCT: 51 % (ref 39.0–52.0)
Hemoglobin: 17.2 g/dL — ABNORMAL HIGH (ref 13.0–17.0)
Immature Granulocytes: 0 %
Lymphocytes Relative: 14 %
Lymphs Abs: 1.8 K/uL (ref 0.7–4.0)
MCH: 27.5 pg (ref 26.0–34.0)
MCHC: 33.7 g/dL (ref 30.0–36.0)
MCV: 81.5 fL (ref 80.0–100.0)
Monocytes Absolute: 0.6 K/uL (ref 0.1–1.0)
Monocytes Relative: 5 %
Neutro Abs: 10.3 K/uL — ABNORMAL HIGH (ref 1.7–7.7)
Neutrophils Relative %: 80 %
Platelets: 233 K/uL (ref 150–400)
RBC: 6.26 MIL/uL — ABNORMAL HIGH (ref 4.22–5.81)
RDW: 13.1 % (ref 11.5–15.5)
WBC: 12.8 K/uL — ABNORMAL HIGH (ref 4.0–10.5)
nRBC: 0 % (ref 0.0–0.2)

## 2024-03-10 MED ORDER — IOHEXOL 300 MG/ML  SOLN
100.0000 mL | Freq: Once | INTRAMUSCULAR | Status: AC | PRN
  Administered 2024-03-10: 100 mL via INTRAVENOUS

## 2024-03-10 MED ORDER — TAMSULOSIN HCL 0.4 MG PO CAPS
0.4000 mg | ORAL_CAPSULE | Freq: Once | ORAL | Status: DC
Start: 2024-03-10 — End: 2024-03-11
  Filled 2024-03-10: qty 1

## 2024-03-10 MED ORDER — HYDROMORPHONE HCL 1 MG/ML IJ SOLN
0.5000 mg | Freq: Once | INTRAMUSCULAR | Status: AC
Start: 2024-03-10 — End: 2024-03-10
  Administered 2024-03-10: 0.5 mg via INTRAVENOUS
  Filled 2024-03-10: qty 1

## 2024-03-10 MED ORDER — HYDROMORPHONE HCL 1 MG/ML IJ SOLN
2.0000 mg | Freq: Once | INTRAMUSCULAR | Status: AC
  Administered 2024-03-10: 2 mg via INTRAVENOUS
  Filled 2024-03-10: qty 2

## 2024-03-10 MED ORDER — KETOROLAC TROMETHAMINE 30 MG/ML IJ SOLN
30.0000 mg | Freq: Once | INTRAMUSCULAR | Status: AC
  Administered 2024-03-10: 30 mg via INTRAVENOUS
  Filled 2024-03-10: qty 1

## 2024-03-10 MED ORDER — HYDROMORPHONE HCL 1 MG/ML IJ SOLN
1.0000 mg | Freq: Once | INTRAMUSCULAR | Status: AC
  Administered 2024-03-10: 1 mg via INTRAVENOUS
  Filled 2024-03-10: qty 1

## 2024-03-10 NOTE — Discharge Instructions (Signed)
 The CT scan did not show any signs of complications from your recent procedure.  The pain is likely related to spasms from removing the stent.  Continue your oxycodone  as well as the Flomax .  You could also supplement with Tylenol  and or ibuprofen.  Follow-up with your urologist to be rechecked.

## 2024-03-10 NOTE — ED Provider Notes (Signed)
 [place] EMERGENCY DEPARTMENT AT [facility] Provider Note   CSN: [id number] Arrival date & time: 03/10/24  1406     Patient presents with: Post-op Problem and Flank Pain   Justin Decker is a 43 y.o. male.    Flank Pain     Patient has a history of acid reflux, kidney stones, hypertension.  Patient states he was recently diagnosed with kidney stones.  He had a stent placed last Thursday.  He was instructed to remove the stent today.  After doing that he began having severe pain in his right flank pain the pain is severe.  It has not responded to the oral medications that he had at home.  He has not had any fevers.  Called the urologist office and instructed to come to the ED  Prior to Admission medications   Medication Sig Start Date End Date Taking? Authorizing Provider  dexlansoprazole (DEXILANT) 60 MG capsule TAKE ONE CAPSULE BY MOUTH EVERY DAY 11/25/14   [provider]  flecainide (TAMBOCOR) 100 MG tablet Take 100 mg by mouth 2 (two) times daily. Reported on 10/18/2015 03/11/15   [provider]  hyoscyamine (ANASPAZ) 0.125 MG TBDP disintergrating tablet Place 1 tablet (0.125 mg total) under the tongue every 6 (six) hours as needed for up to 20 doses. 03/06/24   Shane Steffan BROCKS, MD  methocarbamol (ROBAXIN) 750 MG tablet Take 1 tablet (750 mg total) by mouth 4 (four) times daily for 20 doses. 03/06/24 03/11/24  Shane Steffan BROCKS, MD  Multiple Vitamins-Minerals (MULTIVITAMIN WITH MINERALS) tablet Take 1 tablet by mouth daily.    [provider]  Omega-3 Fatty Acids (FISH OIL) 1000 MG CAPS Take 1,000 mg by mouth daily.    [provider]  phenazopyridine (PYRIDIUM) 200 MG tablet Take 1 tablet (200 mg total) by mouth 3 (three) times daily as needed for up to 6 doses. 03/06/24   Shane Steffan BROCKS, MD  tamsulosin  (FLOMAX ) 0.4 MG CAPS capsule Take 1 capsule (0.4 mg total) by mouth daily after supper. 03/03/24   Shane Steffan BROCKS, MD   traMADol (ULTRAM) 50 MG tablet Take 1 tablet (50 mg total) by mouth every 6 (six) hours as needed for up to 6 doses. 03/06/24   Shane Steffan BROCKS, MD  valsartan (DIOVAN) 80 MG tablet Take 1 tablet (80 mg total) by mouth daily. 03/04/24   Almarie Waddell NOVAK, NP    Allergies: Clindamycin/lincomycin, Metoprolol, Moxifloxacin, and Prednisone    Review of Systems  Genitourinary:  Positive for flank pain.    Updated Vital Signs BP (!) 169/85   Pulse 82   Temp 98.1 F (36.7 C) (Oral)   Resp 15   SpO2 93%   Physical Exam Vitals and nursing note reviewed.  Constitutional:      Appearance: He is well-developed. He is ill-appearing.     Comments: Hyperventilating, appears to be in pain  HENT:     Head: Normocephalic and atraumatic.     Right Ear: External ear normal.     Left Ear: External ear normal.  Eyes:     General: No scleral icterus.       Right eye: No discharge.        Left eye: No discharge.     Conjunctiva/sclera: Conjunctivae normal.  Neck:     Trachea: No tracheal deviation.  Cardiovascular:     Rate and Rhythm: Normal rate and regular rhythm.  Pulmonary:     Effort: Pulmonary effort is normal.  No respiratory distress.     Breath sounds: Normal breath sounds. No stridor. No wheezing or rales.  Abdominal:     General: Bowel sounds are normal. There is no distension.     Palpations: Abdomen is soft.     Tenderness: There is abdominal tenderness. There is left CVA tenderness. There is no guarding or rebound.     Comments: Tenderness left side of abdomen  Musculoskeletal:        General: No tenderness or deformity.     Cervical back: Neck supple.  Skin:    General: Skin is warm and dry.     Findings: No rash.  Neurological:     General: No focal deficit present.     Mental Status: He is alert.     Cranial Nerves: No cranial nerve deficit, dysarthria or facial asymmetry.     Sensory: No sensory deficit.     Motor: No abnormal muscle tone or seizure activity.      Coordination: Coordination normal.  Psychiatric:        Mood and Affect: Mood normal.     (all labs ordered are listed, but only abnormal results are displayed) Labs Reviewed  CBC WITH DIFFERENTIAL/PLATELET - Abnormal; Notable for the following components:      Result Value   WBC 12.8 (*)    RBC 6.26 (*)    Hemoglobin 17.2 (*)    Neutro Abs 10.3 (*)    All other components within normal limits  COMPREHENSIVE METABOLIC PANEL WITH GFR - Abnormal; Notable for the following components:   Sodium 134 (*)    Glucose, Bld 136 (*)    All other components within normal limits  URINALYSIS, ROUTINE W REFLEX MICROSCOPIC - Abnormal; Notable for the following components:   APPearance HAZY (*)    Specific Gravity, Urine >1.046 (*)    Hgb urine dipstick MODERATE (*)    Ketones, ur 20 (*)    All other components within normal limits  URINE CULTURE    EKG: None  Radiology: CT ABDOMEN PELVIS W CONTRAST Result Date: 03/10/2024 EXAM: CT ABDOMEN AND PELVIS WITH CONTRAST 03/10/2024 04:31:58 PM TECHNIQUE: CT of the abdomen and pelvis was performed with the administration of intravenous contrast. Multiplanar reformatted images are provided for review. Automated exposure control, iterative reconstruction, and/or weight-based adjustment of the mA/kV was utilized to reduce the radiation dose to as low as reasonably achievable. COMPARISON: None available. CLINICAL HISTORY: Abdominal pain, post-op; Recent stent removal from left ureter, now significantly worsening pain Abdominal pain, post-op; Recent stent removal from left ureter, now significantly worsening pain FINDINGS: LOWER CHEST: No acute abnormality. LIVER: The liver is unremarkable. GALLBLADDER AND BILE DUCTS: Gallbladder is unremarkable. No biliary ductal dilatation. SPLEEN: No acute abnormality. PANCREAS: No acute abnormality. ADRENAL GLANDS: No acute abnormality. KIDNEYS, URETERS AND BLADDER: No stones in the kidneys or ureters. No hydronephrosis. No  perinephric or periureteral stranding. Urinary bladder is unremarkable. GI AND BOWEL: Stomach demonstrates no acute abnormality. There is no bowel obstruction. PERITONEUM AND RETROPERITONEUM: No ascites. No free air. VASCULATURE: Aorta is normal in caliber. LYMPH NODES: No lymphadenopathy. REPRODUCTIVE ORGANS: No acute abnormality. BONES AND SOFT TISSUES: No acute osseous abnormality. No focal soft tissue abnormality. IMPRESSION: 1. No acute findings in the abdomen or pelvis. Electronically signed by: Dayne Hassell MD 03/10/2024 05:17 PM EST RP Workstation: [id number]     Procedures   Medications Ordered in the ED  tamsulosin  (FLOMAX ) capsule 0.4 mg (0.4 mg Oral Patient Refused/Not Given 03/10/24 1913)  HYDROmorphone  (DILAUDID ) injection 1 mg (1 mg Intravenous Given 03/10/24 1443)  iohexol  (OMNIPAQUE ) 300 MG/ML solution 100 mL (100 mLs Intravenous Contrast Given 03/10/24 1621)  HYDROmorphone  (DILAUDID ) injection 2 mg (2 mg Intravenous Given 03/10/24 1653)  ketorolac  (TORADOL ) 30 MG/ML injection 30 mg (30 mg Intravenous Given 03/10/24 1913)    Clinical Course as of 03/10/24 2144  Mon Mar 10, 2024  1738 CBC with Differential(!) CBC shows elevated white blood cell count hemoglobin increased at 17 [JK]  1738 Comprehensive metabolic panel(!) No significant metabolic abnormalities [JK]  1738 CT scan does not show acute abnormalities [JK]  1742 Patient is more comfortable after pain medications.  Currently resting [JK]  1748 Discussed with Dr Norva.  Suspects pain related to ureteral spasms.  Ok for marriott [JK]  2127 Urinalysis, Routine w reflex microscopic -Urine, Clean Catch(!) Urinalysis does show hematuria 11-20 white cells noted but no bacteria doubt infection [JK]  2143 Patient is feeling better.  Pain is under control [JK]    Clinical Course User Index [JK] Randol Simmonds, MD                                 Medical Decision Making Problems Addressed: Flank pain, unspecified  laterality: acute illness or injury that poses a threat to life or bodily functions  Amount and/or Complexity of Data Reviewed Labs: ordered. Decision-making details documented in ED Course. Radiology: ordered and independent interpretation performed.  Risk Prescription drug management. Parenteral controlled substances. Drug therapy requiring intensive monitoring for toxicity.  Previous records reviewed.  Patient had a renal pelvic calculus measuring 7 x 13 x 9 mm as well as several small nonobstructive calyceal calculi without hydronephrosis.  This was noted on CT scan October 31  Patient presented with acute pain after removing his stent.  ED workup reassuring.  No signs of acute blood loss anemia.  No signs of acute kidney injury.  Patient CT scan did not show any signs of obstruction, ureteral stone or other acute abnormality.  Patient's urinalysis is consistent with his recent procedure and stent removal but doubt infection.  Patient was treated with IV narcotic pain medications and Toradol .  He was monitored for several hours and did not have any recurrence of his severe pain.  Case was discussed with urology.  Patient is stable for continued outpatient management     Final diagnoses:  Flank pain, unspecified laterality    ED Discharge Orders     None          Randol Simmonds, MD 03/10/24 2145

## 2024-03-10 NOTE — ED Triage Notes (Signed)
 Patient c/o right flank pain after pulling the string from his stent placement last Thursday.  Patient report pain radiates to his RUQ abdomen. Patient report taking PRN pain medication with no relief. Patient denies fever.

## 2024-03-10 NOTE — ED Provider Triage Note (Signed)
 Emergency Medicine Provider Triage Evaluation Note  Justin Decker , a 43 y.o. male  was evaluated in triage.  Pt complains of worsening left lower quadrant/flank pain since he had his surgery on 11-13.  Reports that he moved to send today and had extremely worsening of pain.  Has had nausea and vomiting.  Pain controlled with pain medication.  No fevers.  Still been to urinate.  No hematuria.  No difficulty in urination.  Denies any radiation or pain into his testicles or penis  Review of Systems  Positive:  Negative:   Physical Exam  BP (!) 131/91 (BP Location: Left Arm)   Pulse 72   Temp 98.4 F (36.9 C) (Oral)   Resp 16   SpO2 99%  Gen:   Awake, no distress   Resp:  Normal effort  MSK:   Moves extremities without difficulty  Other:  Tender to palpation of the left lower quadrant and flank  Medical Decision Making  Medically screening exam initiated at 2:36 PM.  Appropriate orders placed.  Justin Decker was informed that the remainder of the evaluation will be completed by another provider, this initial triage assessment does not replace that evaluation, and the importance of remaining in the ED until their evaluation is complete.  CT imaging and labs ordered   Bernis Ernst, [place] 03/10/24 1437

## 2024-03-11 LAB — URINE CULTURE: Culture: NO GROWTH

## 2024-03-18 ENCOUNTER — Ambulatory Visit: Payer: Self-pay

## 2024-03-18 ENCOUNTER — Other Ambulatory Visit: Payer: Self-pay

## 2024-03-18 ENCOUNTER — Ambulatory Visit
Admission: EM | Admit: 2024-03-18 | Discharge: 2024-03-18 | Disposition: A | Discharge status: Home / Self Care | Payer: Self-pay | Attending: Student | Admitting: Student

## 2024-03-18 DIAGNOSIS — I10 Essential (primary) hypertension: Secondary | ICD-10-CM

## 2024-03-18 DIAGNOSIS — J069 Acute upper respiratory infection, unspecified: Secondary | ICD-10-CM

## 2024-03-18 MED ORDER — AZITHROMYCIN 250 MG PO TABS: 250.0000 mg | ORAL_TABLET | Freq: Every day | ORAL | 0 refills | Status: AC

## 2024-03-18 MED ORDER — PSEUDOEPHEDRINE HCL 30 MG PO TABS: 30.0000 mg | ORAL_TABLET | Freq: Three times a day (TID) | ORAL | 0 refills | Status: AC | PRN

## 2024-03-18 MED ORDER — AZELASTINE HCL 0.1 % NA SOLN: 2.0000 | Freq: Two times a day (BID) | NASAL | 2 refills | Status: AC

## 2024-03-18 MED ORDER — OXYMETAZOLINE HCL 0.05 % NA SOLN: 1.0000 | Freq: Two times a day (BID) | NASAL | 0 refills | Status: AC

## 2024-03-18 NOTE — ED Provider Notes (Signed)
 [facility]    CSN: [id number] Arrival date & time: 03/18/24  0944      History   Chief Complaint No chief complaint on file.   HPI CURVIN HUNGER is a 43 y.o. male presenting with cough. H/o GERD. Pt c/o nasal drainage that is brown and green in color, dry cough, bodyaches x 4d. Notes significant nasal congestion. Notes mild sinus pressure. Denies bloody sputum. Has been using a nasal lavage (Distilled water). H/o sinus surgery years ago. Notes dry cough - attributes to losartan. Denies fevers. Denies n/v/d/abd pain. Hasn't attempted any medications at home for these symptoms. Denies allergic rhintis.  Was in the ER for kidney stone last week. Today denies urinary symptoms or abd/back pain. BP running 140s/70-80s at home. Denies CP, SOB, dizziness, HA.  HPI  Past Medical History:  Diagnosis Date   Abnormal heart rhythm    GERD (gastroesophageal reflux disease)    Kidney stones    Seasonal allergies     Patient Active Problem List   Diagnosis Date Noted   Prediabetes 03/04/2024   Acid reflux 03/17/2015   BP (high blood pressure) 03/17/2015   Pure hypercholesterolemia 03/11/2015   Allergy to environmental factors 09/03/2013   Beat, premature ventricular 09/03/2013    Past Surgical History:  Procedure Laterality Date   CYSTOSCOPY W/ RETROGRADES Left 03/06/2024   Procedure: CYSTOSCOPY, WITH RETROGRADE PYELOGRAM;  Surgeon: Shane Steffan BROCKS, MD;  Location: [facility];  Service: Urology;  Laterality: Left;   CYSTOSCOPY/URETEROSCOPY/HOLMIUM LASER/STENT PLACEMENT Left 03/06/2024   Procedure: CYSTOSCOPY/URETEROSCOPY/HOLMIUM LASER/STENT PLACEMENT;  Surgeon: Shane Steffan BROCKS, MD;  Location: [facility];  Service: Urology;  Laterality: Left;   EXTRACORPOREAL SHOCK WAVE LITHOTRIPSY Left 07/01/2015   Procedure: EXTRACORPOREAL SHOCK WAVE LITHOTRIPSY (ESWL);  Surgeon: Ozell JONELLE Burkes, MD;  Location: [facility];  Service: Urology;  Laterality: Left;   EXTRACORPOREAL SHOCK WAVE  LITHOTRIPSY Right 04/29/2020   Procedure: EXTRACORPOREAL SHOCK WAVE LITHOTRIPSY (ESWL);  Surgeon: Penne Knee, MD;  Location: [facility];  Service: Urology;  Laterality: Right;   EXTRACORPOREAL SHOCK WAVE LITHOTRIPSY Left 03/03/2024   Procedure: LITHOTRIPSY, ESWL;  Surgeon: Shane Steffan BROCKS, MD;  Location: [facility];  Service: Urology;  Laterality: Left;   INGUINAL HERNIA REPAIR Right 04/25/1987   NASAL SEPTUM SURGERY  2010       Home Medications    Prior to Admission medications   Medication Sig Start Date End Date Taking? Authorizing Provider  azelastine  (ASTELIN ) 0.1 % nasal spray Place 2 sprays into both nostrils 2 (two) times daily. Use in each nostril as directed 03/18/24  Yes Arlyss Leita FORBES, PA-C  azithromycin  (ZITHROMAX  Z-PAK) 250 MG tablet Take 1 tablet (250 mg total) by mouth daily. Two pills (500mg ) day 1. One pill per day (250mg ) days 2-5. 03/18/24  Yes Maurina Fawaz E, PA-C  oxymetazoline  (AFRIN NASAL SPRAY) 0.05 % nasal spray Place 1 spray into both nostrils 2 (two) times daily. 03/18/24  Yes Marlana Mckowen E, PA-C  pseudoephedrine  (SUDAFED) 30 MG tablet Take 1 tablet (30 mg total) by mouth every 8 (eight) hours as needed for congestion. 03/18/24  Yes Toree Edling E, PA-C  dexlansoprazole (DEXILANT) 60 MG capsule TAKE ONE CAPSULE BY MOUTH EVERY DAY 11/25/14   [provider]  flecainide (TAMBOCOR) 100 MG tablet Take 100 mg by mouth 2 (two) times daily. Reported on 10/18/2015 03/11/15   [provider]  Multiple Vitamins-Minerals (MULTIVITAMIN WITH MINERALS) tablet Take 1 tablet by mouth daily.    [provider]  Omega-3 Fatty Acids (FISH OIL) 1000 MG CAPS Take 1,000 mg by mouth daily.    [provider]  phenazopyridine  (PYRIDIUM ) 200 MG tablet Take 1 tablet (200 mg total) by mouth 3 (three) times daily as needed for up to 6 doses. 03/06/24   Shane Steffan BROCKS, MD  valsartan  (DIOVAN ) 80 MG tablet Take 1 tablet (80 mg total) by mouth daily.  03/04/24   Almarie Waddell NOVAK, NP    Family History Family History  Problem Relation Age of Onset   Heart attack Father    Diabetes Maternal Grandfather    Heart attack Paternal Grandfather     Social History Social History   Tobacco Use   Smoking status: Former    Current packs/day: 0.00    Types: Cigarettes    Quit date: 04/24/2005    Years since quitting: 18.9   Smokeless tobacco: Never  Substance Use Topics   Alcohol use: Yes    Alcohol/week: 0.0 standard drinks of alcohol    Comment: social   Drug use: Never     Allergies   Clindamycin/lincomycin, Metoprolol, Moxifloxacin, and Prednisone   Review of Systems Review of Systems  Constitutional:  Negative for appetite change, chills and fever.  HENT:  Positive for congestion. Negative for ear pain, rhinorrhea, sinus pressure, sinus pain and sore throat.   Eyes:  Negative for redness and visual disturbance.  Respiratory:  Positive for cough. Negative for chest tightness, shortness of breath and wheezing.   Cardiovascular:  Negative for chest pain and palpitations.  Gastrointestinal:  Negative for abdominal pain, constipation, diarrhea, nausea and vomiting.  Genitourinary:  Negative for dysuria, frequency and urgency.  Musculoskeletal:  Negative for myalgias.  Neurological:  Negative for dizziness, weakness and headaches.  Psychiatric/Behavioral:  Negative for confusion.   All other systems reviewed and are negative.    Physical Exam Triage Vital Signs ED Triage Vitals  Encounter Vitals Group     BP      Girls Systolic BP Percentile      Girls Diastolic BP Percentile      Boys Systolic BP Percentile      Boys Diastolic BP Percentile      Pulse      Resp      Temp      Temp src      SpO2      Weight      Height      Head Circumference      Peak Flow      Pain Score      Pain Loc      Pain Education      Exclude from Growth Chart    No data found.  Updated Vital Signs BP (!) 164/124   Pulse 97    Temp 99.1 F (37.3 C) (Oral)   Resp 17   SpO2 97%   Visual Acuity Right Eye Distance:   Left Eye Distance:   Bilateral Distance:    Right Eye Near:   Left Eye Near:    Bilateral Near:     Physical Exam Vitals reviewed.  Constitutional:      General: He is not in acute distress.    Appearance: Normal appearance. He is not ill-appearing.  HENT:     Head: Normocephalic and atraumatic.     Right Ear: Tympanic membrane, ear canal and external ear normal. No tenderness. No middle ear effusion. There is no impacted cerumen. Tympanic membrane is not perforated, erythematous, retracted or bulging.  Left Ear: Tympanic membrane, ear canal and external ear normal. No tenderness.  No middle ear effusion. There is no impacted cerumen. Tympanic membrane is not perforated, erythematous, retracted or bulging.     Nose: Nose normal. No congestion.     Right Sinus: No maxillary sinus tenderness or frontal sinus tenderness.     Left Sinus: No maxillary sinus tenderness or frontal sinus tenderness.     Mouth/Throat:     Mouth: Mucous membranes are moist.     Pharynx: Uvula midline. Posterior oropharyngeal erythema present. No oropharyngeal exudate.     Tonsils: No tonsillar exudate. 2+ on the right. 2+ on the left.     Comments: Tonsils 2+ bilaterally with erythema but no exudate.  Eyes:     Extraocular Movements: Extraocular movements intact.     Pupils: Pupils are equal, round, and reactive to light.  Cardiovascular:     Rate and Rhythm: Normal rate and regular rhythm.     Heart sounds: Normal heart sounds.  Pulmonary:     Effort: Pulmonary effort is normal.     Breath sounds: Normal breath sounds. No decreased breath sounds, wheezing, rhonchi or rales.  Abdominal:     Palpations: Abdomen is soft.     Tenderness: There is no abdominal tenderness. There is no guarding or rebound.  Lymphadenopathy:     Cervical: No cervical adenopathy.     Right cervical: No superficial, deep or posterior  cervical adenopathy.    Left cervical: No superficial, deep or posterior cervical adenopathy.  Skin:    Comments: No rash   Neurological:     General: No focal deficit present.     Mental Status: He is alert and oriented to person, place, and time.  Psychiatric:        Mood and Affect: Mood normal.        Behavior: Behavior normal.        Thought Content: Thought content normal.        Judgment: Judgment normal.      UC Treatments / Results  Labs (all labs ordered are listed, but only abnormal results are displayed) Labs Reviewed - No data to display  EKG   Radiology No results found.  Procedures Procedures (including critical care time)  Medications Ordered in UC Medications - No data to display  Initial Impression / Assessment and Plan / UC Course  I have reviewed the triage vital signs and the nursing notes.  Pertinent labs & imaging results that were available during my care of the patient were reviewed by me and considered in my medical decision making (see chart for details).     Patient is a pleasant 43 year old male presenting with viral URI with cough. The patient is afebrile and nontachycardic.  Antipyretic has not been administered today.  Centor score 1, rapid strep not indicated.  Did not check a COVID or influenza test due to duration of symptoms.  Blood pressure is elevated on multiple readings in clinic.  Home readings are 140s/70s-80s.  Patient with long history of whitecoat hypertension.  He is not experiencing chest pain, shortness of breath, headache, dizziness.  Continue to monitor at home, and follow-up with primary care provider if elevated reading at home.  Presentation is consistent with viral URI with cough.  He does not have a history of lung disease, his lungs are clear to auscultation, and he is not coughing up dark or red sputum.  He is not having significant sinus pressure, and has had  symptoms for only 4 days.  We will initiate several  medications to reduce congestion, and hopefully prevent sinusitis.  Start azelastine , Afrin, and Sudafed as below.  Monitor blood pressure, and stop medications if blood pressure is elevated at home.  Continue nasal lavage using distilled water.  Patient does have a long history of sinusitis, so I sent a Z-Pak, which he can pick up in 3 days on 03/21/2024, if his symptoms worsen instead of improve.  Return precautions as below.  Final Clinical Impressions(s) / UC Diagnoses   Final diagnoses:  Viral URI with cough  Essential hypertension     Discharge Instructions      -Typically it takes 7-10 days to develop a sinus infection. We will start by trying to reduce your congestion, which can help prevent sinusitis.  - Use the Sudafed, 1 pill up to every 4 hours for nasal congestion.  This medication will give you energy, so do not take right before bed. - Azelastine  nasal spray, 2 sprays twice daily while symptoms persist. -Afrin nasal spray for up to 3 days in a row -If your symptoms worsen instead of improve, including worsening sinus pain, new fevers - you can pick up the z-pack on Friday 11/28 -Your cough should slowly get better instead of worse. If you develop a cough productive of dark or red sputum, new shortness of breath, new chest tightness, new fevers, etc - seek additional care.      ED Prescriptions     Medication Sig Dispense Auth. Provider   oxymetazoline  (AFRIN NASAL SPRAY) 0.05 % nasal spray Place 1 spray into both nostrils 2 (two) times daily. 30 mL Heavin Sebree E, PA-C   azelastine  (ASTELIN ) 0.1 % nasal spray Place 2 sprays into both nostrils 2 (two) times daily. Use in each nostril as directed 30 mL Arlyss Leita BRAVO, PA-C   pseudoephedrine  (SUDAFED) 30 MG tablet Take 1 tablet (30 mg total) by mouth every 8 (eight) hours as needed for congestion. 30 tablet Mathayus Stanbery E, PA-C   azithromycin  (ZITHROMAX  Z-PAK) 250 MG tablet Take 1 tablet (250 mg total) by mouth daily. Two  pills (500mg ) day 1. One pill per day (250mg ) days 2-5. 6 tablet Levan Aloia E, PA-C      PDMP not reviewed this encounter.   Arlyss Leita BRAVO, PA-C 03/18/24 1038

## 2024-03-18 NOTE — ED Triage Notes (Signed)
 Pt c/o nasal drainage, productive cough w/brown/green mucous, bodyachesx4d.

## 2024-03-18 NOTE — Discharge Instructions (Signed)
-  Typically it takes 7-10 days to develop a sinus infection. We will start by trying to reduce your congestion, which can help prevent sinusitis.  - Use the Sudafed, 1 pill up to every 4 hours for nasal congestion.  This medication will give you energy, so do not take right before bed. - Azelastine  nasal spray, 2 sprays twice daily while symptoms persist. -Afrin nasal spray for up to 3 days in a row -If your symptoms worsen instead of improve, including worsening sinus pain, new fevers - you can pick up the z-pack on Friday 11/28 -Your cough should slowly get better instead of worse. If you develop a cough productive of dark or red sputum, new shortness of breath, new chest tightness, new fevers, etc - seek additional care.

## 2024-04-29 ENCOUNTER — Encounter: Payer: Self-pay | Admitting: Family Medicine

## 2024-06-04 ENCOUNTER — Ambulatory Visit: Payer: Self-pay | Admitting: Family Medicine
